# Patient Record
Sex: Female | Born: 1985 | Race: Black or African American | Hispanic: No | Marital: Single | Smoking: Never smoker
Health system: Southern US, Community
[De-identification: ages and names within clinical notes are randomized; demographics above are authoritative.]

## PROBLEM LIST (undated history)

## (undated) ENCOUNTER — Inpatient Hospital Stay: Payer: Self-pay

## (undated) ENCOUNTER — Inpatient Hospital Stay (HOSPITAL_COMMUNITY): Payer: Self-pay

## (undated) DIAGNOSIS — K219 Gastro-esophageal reflux disease without esophagitis: Secondary | ICD-10-CM

## (undated) DIAGNOSIS — G43909 Migraine, unspecified, not intractable, without status migrainosus: Secondary | ICD-10-CM

## (undated) DIAGNOSIS — F329 Major depressive disorder, single episode, unspecified: Secondary | ICD-10-CM

## (undated) DIAGNOSIS — O24419 Gestational diabetes mellitus in pregnancy, unspecified control: Secondary | ICD-10-CM

## (undated) DIAGNOSIS — J45909 Unspecified asthma, uncomplicated: Secondary | ICD-10-CM

## (undated) DIAGNOSIS — F32A Depression, unspecified: Secondary | ICD-10-CM

## (undated) DIAGNOSIS — F419 Anxiety disorder, unspecified: Secondary | ICD-10-CM

## (undated) HISTORY — PX: CHOLECYSTECTOMY: SHX55

## (undated) HISTORY — PX: BARTHOLIN GLAND CYST EXCISION: SHX565

## (undated) HISTORY — PX: RECTAL SURGERY: SHX760

---

## 2006-08-28 ENCOUNTER — Encounter: Payer: Self-pay | Admitting: Maternal & Fetal Medicine

## 2006-10-30 ENCOUNTER — Observation Stay: Payer: Self-pay | Admitting: Obstetrics & Gynecology

## 2008-03-11 ENCOUNTER — Emergency Department: Payer: Self-pay | Admitting: Emergency Medicine

## 2008-04-07 ENCOUNTER — Emergency Department: Payer: Self-pay | Admitting: Emergency Medicine

## 2012-01-30 ENCOUNTER — Ambulatory Visit: Payer: Self-pay | Admitting: Emergency Medicine

## 2012-06-26 ENCOUNTER — Emergency Department: Payer: Self-pay | Admitting: Emergency Medicine

## 2012-06-26 LAB — URINALYSIS, COMPLETE
Bacteria: NONE SEEN
Glucose,UR: NEGATIVE mg/dL (ref 0–75)
Nitrite: NEGATIVE
Ph: 6 (ref 4.5–8.0)
Protein: NEGATIVE
Specific Gravity: 1.027 (ref 1.003–1.030)
WBC UR: <1 /HPF (ref 0–5)

## 2012-06-26 LAB — CBC
HCT: 37.5 % (ref 35.0–47.0)
HGB: 12.8 g/dL (ref 12.0–16.0)
MCH: 28.2 pg (ref 26.0–34.0)
MCHC: 34 g/dL (ref 32.0–36.0)
Platelet: 237 10*3/uL (ref 150–440)
RBC: 4.52 10*6/uL (ref 3.80–5.20)
WBC: 9.3 10*3/uL (ref 3.6–11.0)

## 2012-06-26 LAB — BASIC METABOLIC PANEL
Anion Gap: 7 (ref 7–16)
Calcium, Total: 8.8 mg/dL (ref 8.5–10.1)
Creatinine: 0.8 mg/dL (ref 0.60–1.30)
Glucose: 115 mg/dL — ABNORMAL HIGH (ref 65–99)
Sodium: 140 mmol/L (ref 136–145)

## 2012-06-26 LAB — TROPONIN I: Troponin-I: <0.02 ng/mL

## 2012-08-16 ENCOUNTER — Emergency Department: Payer: Self-pay | Admitting: Emergency Medicine

## 2012-08-16 LAB — COMPREHENSIVE METABOLIC PANEL
Albumin: 3.5 g/dL (ref 3.4–5.0)
Anion Gap: 5 — ABNORMAL LOW (ref 7–16)
Chloride: 108 mmol/L — ABNORMAL HIGH (ref 98–107)
Co2: 25 mmol/L (ref 21–32)
Creatinine: 0.91 mg/dL (ref 0.60–1.30)
EGFR (African American): >60
EGFR (Non-African Amer.): >60
Osmolality: 274 (ref 275–301)
Potassium: 4 mmol/L (ref 3.5–5.1)
SGOT(AST): 33 U/L (ref 15–37)

## 2012-08-16 LAB — URINALYSIS, COMPLETE
Blood: NEGATIVE
Leukocyte Esterase: NEGATIVE
Nitrite: NEGATIVE
Ph: 7 (ref 4.5–8.0)
Protein: NEGATIVE
Squamous Epithelial: 3
WBC UR: <1 /HPF (ref 0–5)

## 2012-08-16 LAB — DRUG SCREEN, URINE
Barbiturates, Ur Screen: NEGATIVE (ref ?–200)
Cocaine Metabolite,Ur ~~LOC~~: NEGATIVE (ref ?–300)
Methadone, Ur Screen: NEGATIVE (ref ?–300)
Phencyclidine (PCP) Ur S: NEGATIVE (ref ?–25)
Tricyclic, Ur Screen: NEGATIVE (ref ?–1000)

## 2012-08-16 LAB — CBC
MCH: 28.3 pg (ref 26.0–34.0)
MCV: 82 fL (ref 80–100)
Platelet: 205 10*3/uL (ref 150–440)
RBC: 4.45 10*6/uL (ref 3.80–5.20)
RDW: 13 % (ref 11.5–14.5)
WBC: 8.3 10*3/uL (ref 3.6–11.0)

## 2012-08-16 LAB — TROPONIN I: Troponin-I: <0.02 ng/mL

## 2012-08-22 ENCOUNTER — Emergency Department: Payer: Self-pay | Admitting: Emergency Medicine

## 2012-08-22 LAB — URINALYSIS, COMPLETE
Bacteria: NONE SEEN
Glucose,UR: NEGATIVE mg/dL (ref 0–75)
Ketone: NEGATIVE
Leukocyte Esterase: NEGATIVE
Nitrite: NEGATIVE
Ph: 8 (ref 4.5–8.0)
Protein: NEGATIVE
RBC,UR: 57 /HPF (ref 0–5)
Specific Gravity: 1.014 (ref 1.003–1.030)
Squamous Epithelial: 2

## 2012-08-22 LAB — DRUG SCREEN, URINE
Benzodiazepine, Ur Scrn: NEGATIVE (ref ?–200)
Cannabinoid 50 Ng, Ur ~~LOC~~: NEGATIVE (ref ?–50)
MDMA (Ecstasy)Ur Screen: NEGATIVE (ref ?–500)
Methadone, Ur Screen: NEGATIVE (ref ?–300)
Opiate, Ur Screen: NEGATIVE (ref ?–300)
Phencyclidine (PCP) Ur S: NEGATIVE (ref ?–25)

## 2012-08-22 LAB — COMPREHENSIVE METABOLIC PANEL
Alkaline Phosphatase: 85 U/L (ref 50–136)
BUN: 6 mg/dL — ABNORMAL LOW (ref 7–18)
Bilirubin,Total: 0.5 mg/dL (ref 0.2–1.0)
Calcium, Total: 8.6 mg/dL (ref 8.5–10.1)
Chloride: 107 mmol/L (ref 98–107)
Creatinine: 1.1 mg/dL (ref 0.60–1.30)
EGFR (African American): >60
EGFR (Non-African Amer.): >60
Potassium: 3.2 mmol/L — ABNORMAL LOW (ref 3.5–5.1)
SGOT(AST): 35 U/L (ref 15–37)
SGPT (ALT): 32 U/L (ref 12–78)
Sodium: 137 mmol/L (ref 136–145)
Total Protein: 7.5 g/dL (ref 6.4–8.2)

## 2012-08-22 LAB — CBC
HCT: 36.7 % (ref 35.0–47.0)
HGB: 12.5 g/dL (ref 12.0–16.0)
MCV: 82 fL (ref 80–100)
RBC: 4.5 10*6/uL (ref 3.80–5.20)
RDW: 13 % (ref 11.5–14.5)
WBC: 9.3 10*3/uL (ref 3.6–11.0)

## 2012-08-22 LAB — ETHANOL: Ethanol: <3 mg/dL

## 2012-08-23 LAB — GC/CHLAMYDIA PROBE AMP

## 2012-10-11 ENCOUNTER — Ambulatory Visit: Payer: Self-pay

## 2012-10-15 ENCOUNTER — Ambulatory Visit: Payer: Self-pay | Admitting: Obstetrics & Gynecology

## 2012-10-26 ENCOUNTER — Ambulatory Visit: Payer: Self-pay | Admitting: Obstetrics & Gynecology

## 2012-11-21 ENCOUNTER — Ambulatory Visit: Payer: Self-pay | Admitting: Family Medicine

## 2012-11-21 LAB — CBC WITH DIFFERENTIAL/PLATELET
Basophil #: 0.1 10*3/uL (ref 0.0–0.1)
Basophil %: 1 %
Eosinophil #: 0.1 10*3/uL (ref 0.0–0.7)
HCT: 41.7 % (ref 35.0–47.0)
Lymphocyte %: 31.4 %
MCH: 27.6 pg (ref 26.0–34.0)
MCHC: 32.8 g/dL (ref 32.0–36.0)
MCV: 84 fL (ref 80–100)
Monocyte #: 0.4 x10 3/mm (ref 0.2–0.9)
Neutrophil #: 4.7 10*3/uL (ref 1.4–6.5)
Neutrophil %: 61.2 %
WBC: 7.6 10*3/uL (ref 3.6–11.0)

## 2012-11-21 LAB — URINALYSIS, COMPLETE
Bacteria: NEGATIVE
Bilirubin,UR: NEGATIVE
Ketone: NEGATIVE
Specific Gravity: 1.01 (ref 1.003–1.030)

## 2012-11-21 LAB — RAPID INFLUENZA A&B ANTIGENS

## 2013-01-24 ENCOUNTER — Inpatient Hospital Stay: Payer: Self-pay | Admitting: Psychiatry

## 2013-01-24 LAB — COMPREHENSIVE METABOLIC PANEL
Albumin: 3.8 g/dL (ref 3.4–5.0)
Bilirubin,Total: 0.4 mg/dL (ref 0.2–1.0)
Calcium, Total: 8.9 mg/dL (ref 8.5–10.1)
Co2: 26 mmol/L (ref 21–32)
Creatinine: 0.98 mg/dL (ref 0.60–1.30)
Glucose: 89 mg/dL (ref 65–99)
Osmolality: 276 (ref 275–301)
Potassium: 3.3 mmol/L — ABNORMAL LOW (ref 3.5–5.1)
SGOT(AST): 22 U/L (ref 15–37)
SGPT (ALT): 24 U/L (ref 12–78)
Total Protein: 8.1 g/dL (ref 6.4–8.2)

## 2013-01-24 LAB — DRUG SCREEN, URINE
Amphetamines, Ur Screen: NEGATIVE (ref ?–1000)
Barbiturates, Ur Screen: NEGATIVE (ref ?–200)
Benzodiazepine, Ur Scrn: POSITIVE (ref ?–200)
Cannabinoid 50 Ng, Ur ~~LOC~~: POSITIVE (ref ?–50)
Cocaine Metabolite,Ur ~~LOC~~: NEGATIVE (ref ?–300)
MDMA (Ecstasy)Ur Screen: POSITIVE (ref ?–500)
Methadone, Ur Screen: NEGATIVE (ref ?–300)
Phencyclidine (PCP) Ur S: NEGATIVE (ref ?–25)
Tricyclic, Ur Screen: POSITIVE (ref ?–1000)

## 2013-01-24 LAB — CBC
HCT: 40.8 % (ref 35.0–47.0)
MCHC: 34.2 g/dL (ref 32.0–36.0)
Platelet: 264 10*3/uL (ref 150–440)
RDW: 13.4 % (ref 11.5–14.5)
WBC: 10.7 10*3/uL (ref 3.6–11.0)

## 2013-01-24 LAB — URINALYSIS, COMPLETE
Bilirubin,UR: NEGATIVE
Blood: NEGATIVE
Glucose,UR: NEGATIVE mg/dL (ref 0–75)
Ph: 6 (ref 4.5–8.0)
Specific Gravity: 1.028 (ref 1.003–1.030)
Squamous Epithelial: 19
WBC UR: 6 /HPF (ref 0–5)

## 2013-01-24 LAB — PREGNANCY, URINE: Pregnancy Test, Urine: NEGATIVE m[IU]/mL

## 2013-01-24 LAB — ETHANOL: Ethanol: <3 mg/dL

## 2013-02-04 ENCOUNTER — Ambulatory Visit: Payer: Self-pay | Admitting: Physician Assistant

## 2013-02-25 ENCOUNTER — Ambulatory Visit: Payer: Self-pay | Admitting: Physician Assistant

## 2013-03-25 ENCOUNTER — Ambulatory Visit: Payer: Self-pay | Admitting: Surgery

## 2013-03-25 LAB — CBC WITH DIFFERENTIAL/PLATELET
BASOS ABS: 0.1 10*3/uL (ref 0.0–0.1)
BASOS PCT: 0.6 %
EOS PCT: 0.3 %
Eosinophil #: 0 10*3/uL (ref 0.0–0.7)
HCT: 44.5 % (ref 35.0–47.0)
HGB: 14.5 g/dL (ref 12.0–16.0)
LYMPHS ABS: 2.1 10*3/uL (ref 1.0–3.6)
LYMPHS PCT: 16 %
MCH: 27.9 pg (ref 26.0–34.0)
MCHC: 32.5 g/dL (ref 32.0–36.0)
MCV: 86 fL (ref 80–100)
MONOS PCT: 6.2 %
Monocyte #: 0.8 x10 3/mm (ref 0.2–0.9)
NEUTROS PCT: 76.9 %
Neutrophil #: 9.8 10*3/uL — ABNORMAL HIGH (ref 1.4–6.5)
Platelet: 300 10*3/uL (ref 150–440)
RBC: 5.18 10*6/uL (ref 3.80–5.20)
RDW: 12.9 % (ref 11.5–14.5)
WBC: 12.8 10*3/uL — AB (ref 3.6–11.0)

## 2013-03-25 LAB — BASIC METABOLIC PANEL
Anion Gap: 4 — ABNORMAL LOW (ref 7–16)
BUN: 13 mg/dL (ref 7–18)
CALCIUM: 9.5 mg/dL (ref 8.5–10.1)
Chloride: 105 mmol/L (ref 98–107)
Co2: 28 mmol/L (ref 21–32)
Creatinine: 0.8 mg/dL (ref 0.60–1.30)
EGFR (Non-African Amer.): >60
GLUCOSE: 100 mg/dL — AB (ref 65–99)
Osmolality: 274 (ref 275–301)
Potassium: 4 mmol/L (ref 3.5–5.1)
Sodium: 137 mmol/L (ref 136–145)

## 2013-04-02 ENCOUNTER — Ambulatory Visit: Payer: Self-pay | Admitting: Surgery

## 2014-01-18 ENCOUNTER — Emergency Department: Payer: Self-pay | Admitting: Emergency Medicine

## 2014-05-23 NOTE — Discharge Summary (Signed)
PATIENT NAME:  Sarah DemarkBROWN, Sarah Contreras MR#:  [id number] DATE OF BIRTH:  1985/12/24  DATE OF ADMISSION:  01/24/2013 DATE OF DISCHARGE:  01/25/2013  HOSPITAL COURSE: See dictated history and physical for details of admission. A 29 year old woman was admitted to the hospital last night because she took an overdose of trazodone. Both last night and today she denies any suicidal intent to it. She states that she has been overwhelmed emotionally yesterday because of the anniversary of the death of the father of her daughter. She just wanted to rest. She has been having problems with depression, but is getting appropriate outpatient treatment. She completely denies any suicidal ideation now. Has not shown any dangerous behavior in the hospital. Has been consistent with her symptoms. She is agreeable to continuing medication and continuing outpatient treatment. At this point, she does not seem to be an acute risk to herself. She has been counseled about the importance of monitoring her mood and thoughts and being very careful and not excessively using any medication. She agrees with all of this. She states that she will be more careful with medication in the future. At this point, she does not appear likely to benefit from further hospitalization and will be discharged home with follow-up in the community.   MENTAL STATUS EXAM AT DISCHARGE: Neatly dressed and groomed woman, looks her stated age. Cooperative with the interview. Good eye contact, normal psychomotor activity. Speech normal rate, tone and volume. Affect reactive, appropriate and normal. No loosening of associations. Denies any auditory or visual hallucinations. No evidence of thought disorder denies suicidal or homicidal ideation. Shows adequate insight and judgment. Normal intelligence. Alert and oriented x 4.   DISCHARGE MEDICATIONS: Topiramate 50 mg per day, which is what she tells me is her normal dose, nortriptyline 20 mg at night, fluoxetine 20 mg a day,  trazodone 150 mg at night, alprazolam 0.5 mg twice a day, albuterol inhaler 2 puffs 4 times a day as needed for shortness of breath, Advair Diskus inhaler 250 mcg 1 puff twice a day. The only prescription she needs is for the Prozac.   LABORATORY RESULTS: Chemistry panel showed slightly low potassium 3.3, chloride 108, otherwise normal. Alcohol level negative. TSH is normal at 1.59. Drug screen positive for MDMA, cannabis and benzodiazepines. CBC all normal. Urinalysis borderline, but contaminated with epithelial cells. Does not appear to necessarily need treatment. She has no symptoms from it.   DISPOSITION: Discharge home. Follow up with CBC.   DIAGNOSIS, PRINCIPAL AND PRIMARY:  AXIS I: Depression, not otherwise specified.   SECONDARY DIAGNOSES: AXIS I: No further diagnosis.  AXIS II: Deferred.  AXIS III: Chronic migraines, asthma.  AXIS IV: Moderate acute stress from recent move with her daughter. Yesterday had acute stress from the anniversary of the death of her daughter's father. That seems to be resolving.  AXIS V: Functioning at time of discharge 55.    ____________________________ Audery AmelJohn T. Clapacs, MD jtc:dp D: 01/25/2013 13:35:38 ET T: 01/25/2013 14:18:10 ET JOB#: [id number]  cc: Audery AmelJohn T. Clapacs, MD, <Dictator> Audery AmelJOHN T CLAPACS MD ELECTRONICALLY SIGNED 01/26/2013 17:00

## 2014-05-23 NOTE — H&P (Signed)
PATIENT NAME:  Sarah Contreras, Sarah Contreras MR#:  [id number] DATE OF BIRTH:  07-06-1985  DATE OF ADMISSION:  01/24/2013  DATE OF ASSESSMENT: 01/25/2013  IDENTIFYING INFORMATION AND CHIEF COMPLAINT: A 29 year old woman admitted from the Emergency Room because of concern about a suicide attempt.   CHIEF COMPLAINT: "I was just overwhelmed and wanted to rest."   HISTORY OF PRESENT ILLNESS: The patient presented to the Emergency Room by EMS last night. She says she called EMS herself when she started to feel sick and dizzy after taking an excessive number of trazodone pills. She says yesterday she was feeling overwhelmed because it was the anniversary of the death of her daughter's father. She felt like she just needed some rest and to relax and so she poured out a handful of her trazodone tablets and took them. She is not sure exactly how many there were, says it could be as many as 20. They were 50 mg pills. She then started after a while to feel sick to her stomach and called 911 herself. She denies at any point that she was having suicidal thoughts. She says she has been mildly depressed recently, but has been getting help from her therapist and psychiatrist. Generally, she has been functioning well and going to work. With her current medication her sleep is usually okay. Her appetite is fairly normal. She denies suicidal ideation, denies any psychotic symptoms. She denies any hallucinations. No homicidal ideation. Denies that she is abusing substances. She is compliant with outpatient treatment.   PAST PSYCHIATRIC HISTORY: She has been seeing Dr. Janeece Riggers at [facility] and also sees a therapist there. In the past, she has been on antidepressants including Zoloft, Celexa, and Wellbutrin. Most recently Dr. Janeece Riggers has switched her to Prozac and she has been taking it for about a week, although she did not take it for the past 3 days. She also takes Xanax 0.5 mg twice a day. She denies any previous suicide attempts or violence. Denies any  previous psychiatric hospitalizations.   SOCIAL HISTORY: The patient lives with her 89-year-old daughter. This is her only child. The daughter's father died exactly 1 year ago yesterday from an automobile accident. The patient identifies as lesbian, but says she is not in a relationship. Her relationship with her family is generally pretty good. She works for the school system regularly. Has a strong sense of responsibility to her daughter.   FAMILY HISTORY: Positive for bipolar disorder in a couple members of her family.   PAST MEDICAL HISTORY: The patient has asthma for which she uses an Advair inhaler as well as p.r.n. albuterol. Also has chronic migraines for which she takes topiramate and nortriptyline regularly.   SUBSTANCE ABUSE HISTORY: Denies any history of alcohol or drug abuse.   REVIEW OF SYSTEMS: Currently says her mood is feeling much better. Denies any depression. Denies suicidal or homicidal ideation. Denies hallucinations. She is not acutely having a headache. Does not feel sick to her stomach. No chest pain. No shortness of breath. Generally negative review of systems.   CURRENT MEDICATIONS: Prozac 20 mg per day, Xanax 0.5 mg twice a day, nortriptyline 20 mg at night, topiramate 50 mg per day, Advair Diskus twice a day, albuterol inhaler p.r.n.   ALLERGIES: SUMATRIPTAN.   MENTAL STATUS EXAMINATION: Neatly groomed woman, looks her stated age, cooperative with the interview. Good eye contact. Normal psychomotor activity. Speech normal rate, tone, and volume. Affect euthymic, reactive, appropriate. No signs of being depressed. Mood stated as being pretty  good today. Thoughts are lucid. No indication of loosening of associations or delusions. Denies auditory or visual hallucinations. Denies any current suicidal or homicidal ideation. Denies any morbid ruminations about death. Has strong positive feelings towards her daughter and her life in general. Alert and oriented x4. Normal  intelligence.   PHYSICAL EXAMINATION: GENERAL: Healthy-appearing woman in no acute distress.  SKIN: No skin lesions identified.  HEENT: Pupils equal and reactive.  NEUROLOGIC: Gait within normal limits. Full range of motion at all extremities. Strength and reflexes normal and symmetric throughout. Cranial nerves symmetric and normal.  LUNGS: Clear with no wheezes.  HEART: Regular rate and rhythm.  ABDOMEN: Soft, nontender. Normal bowel sounds.  VITAL SIGNS: Temperature 99.5, pulse 94, respirations 16, blood pressure 113/77.   LABORATORY RESULTS: Urinalysis is borderline for urinary tract infection, but has a lot of epithelial cells in it. Pregnancy test negative. Drug screen positive for cannabis and MDMA. TSH normal at 1.59. CBC normal. Alcohol negative. Chemistry shows low potassium 3.3, chloride elevated at 108.   ASSESSMENT: A [age] year old woman who took an overdose although not a fatal one of her trazodone. She has consistently indicated she had no suicidal ideation. She did call 911 herself and has been cooperative with treatment. She does have depression, but has multiple positive things in her life to live for. No sign of psychosis. No substance abuse. The patient appears to have been overwhelmed yesterday and made a bad decision about how many trazodone to take. She does not appear to be acutely suicidal. She has appropriate outpatient treatment in the community. Does not appear to have any further need for inpatient treatment.   TREATMENT PLAN: Continue current medication although the plan for today is to discharge her home. She has a follow-up appointment with her therapist on Monday and will follow up with Dr. Janeece RiggersSu for medicine management. I will give her a new prescription for the Prozac since she says she misplaced it in a recent move, which is why she has not had it for the last few days; otherwise, no new prescriptions needed. The patient has been educated about the importance of being  strictly compliant with medication treatment and getting help if she is feeling overwhelmed or ever has any suicidal thoughts.   DIAGNOSIS, AND PRIMARY:  AXIS I: Depression, not otherwise specified.   SECONDARY DIAGNOSES: AXIS I: No further.  AXIS II: Deferred.  AXIS III: Asthma, migraine headaches.  AXIS IV: Moderate from being a single parent and acute severe yesterday from an anniversary of a death.  AXIS V: Functioning at time of evaluation 50.  ____________________________ Audery AmelJohn T. Trevell Pariseau, MD jtc:sb D: 01/25/2013 13:24:30 ET T: 01/25/2013 13:57:57 ET JOB#: [id number]  cc: Audery AmelJohn T. Janne Faulk, MD, <Dictator> Audery AmelJOHN T Joice Nazario MD ELECTRONICALLY SIGNED 01/26/2013 17:00

## 2014-05-23 NOTE — Op Note (Signed)
PATIENT NAME:  Sarah Contreras, Sarah Contreras MR#:  [id number] DATE OF BIRTH:  06-17-1985  DATE OF PROCEDURE:  10/26/2012  PREOPERATIVE DIAGNOSIS: Left chronic recurrent Bartholin cyst.   POSTOPERATIVE DIAGNOSIS: Left chronic recurrent Bartholin cyst.   PROCEDURE PERFORMED: Left marsupialization.   SURGEON: Dierdre Searles. Paul Hobart Marte, MD   ANESTHESIA: General.  ESTIMATED BLOOD LOSS: Minimal.   COMPLICATIONS: None.   FINDINGS: As above.   DISPOSITION: To recovery room, stable.   TECHNIQUE: The patient is prepped and draped in the usual fashion, after adequate anesthesia is obtained, in the dorsal lithotomy position. A drainage point from the chronic Bartholin cyst is visualized and grasped with a Allis clamp. An elliptical skin incision is performed at this site. Dissection with a hemostat reveals the cavity and an area of Bartholin cyst. The edges are then sutured to the skin edge in a circumferential manner, in an interrupted fashion, to create a marsupialization pouch so that it can drain and heal over time. The patient tolerates this procedure well and goes to the recovery room in stable condition. All sponge, instrument and needle counts are correct.  ____________________________ R. Annamarie MajorPaul Keerthana Vanrossum, MD rph:sb D: 10/26/2012 17:04:04 ET T: 10/26/2012 17:10:11 ET JOB#: [id number]  cc: Dierdre Searles. Paul Fransisco Messmer, MD, <Dictator> Nadara MustardOBERT P Andromeda Poppen MD ELECTRONICALLY SIGNED 10/26/2012 23:17

## 2014-05-24 NOTE — Op Note (Signed)
PATIENT NAME:  Sarah DemarkBROWN, Evoleth N MR#:  [id number] DATE OF BIRTH:  1985/09/17  DATE OF PROCEDURE:  04/02/2013  PREOPERATIVE DIAGNOSIS:  Anal fissure.  POSTOPERATIVE DIAGNOSIS:  Anal fissure.  OPERATIONS:  Rectal exam under anesthesia, lateral anal sphincterotomy.   SURGEON: Dr. Michela PitcherEly.  ANESTHESIA: General.   OPERATIVE PROCEDURE: With the patient in the supine position and after the induction of appropriate general anesthesia, the patient was placed in lithotomy position, appropriately padded and positioned. Perianal area was prepped with Betadine with draped with sterile towels. Careful external examination under general anesthesia did reveal a large posterior midline fissure. His rectum was quite tight, barely admitting my index finger. The rectum was slowly dilated under vision using bivalve retractor.  The large ulcer posteriorly was debrided by just scraping the granulation tissue off the surface. No attempt was made to excise the fissure. A lateral anal sphincterotomy was then performed using closed technique, using the bivalve retractor to identify the appropriate muscle layer. The 11 blade was inserted perpendicularly, turned 90 degrees and removed. The index finger was then used to break up the remaining fibers. There was some bleeding from the site, which was controlled with a figure-of-eight sutures of 3-0 Vicryl. The rectum was then easily dilated to 2 fingers without consequence. A packing of Gelfoam and Avitene was inserted in the rectum and the area infiltrated with Exparel local anesthesia.  Sterile dressing was applied. The patient returned to the recovery room having tolerated the procedure well.  Sponge, instrument and needle count were correct x 2 in the operating room.       ____________________________ Quentin Orealph L. Ely III, MD rle:dmm D: 04/02/2013 12:30:50 ET T: 04/02/2013 19:59:11 ET JOB#: [id number]  cc: Carmie Endalph L. Ely III, MD, <Dictator> Letitia CaulHeidi M. Grandis, MD Quentin OreALPH L ELY  MD ELECTRONICALLY SIGNED 04/03/2013 7:39

## 2014-07-07 ENCOUNTER — Other Ambulatory Visit: Payer: Self-pay | Admitting: Family Medicine

## 2014-07-07 ENCOUNTER — Ambulatory Visit
Admission: RE | Admit: 2014-07-07 | Discharge: 2014-07-07 | Disposition: A | Discharge status: Home / Self Care | Payer: Medicaid Other | Source: Ambulatory Visit | Attending: Family Medicine | Admitting: Family Medicine

## 2014-07-07 DIAGNOSIS — R102 Pelvic and perineal pain: Secondary | ICD-10-CM | POA: Diagnosis present

## 2014-07-07 DIAGNOSIS — N859 Noninflammatory disorder of uterus, unspecified: Secondary | ICD-10-CM | POA: Insufficient documentation

## 2014-07-07 DIAGNOSIS — N839 Noninflammatory disorder of ovary, fallopian tube and broad ligament, unspecified: Secondary | ICD-10-CM | POA: Diagnosis not present

## 2014-07-07 DIAGNOSIS — G8929 Other chronic pain: Secondary | ICD-10-CM | POA: Diagnosis present

## 2015-06-12 ENCOUNTER — Emergency Department
Admission: EM | Admit: 2015-06-12 | Discharge: 2015-06-13 | Disposition: A | Discharge status: Home / Self Care | Payer: BLUE CROSS/BLUE SHIELD | Attending: Emergency Medicine | Admitting: Emergency Medicine

## 2015-06-12 ENCOUNTER — Emergency Department: Payer: BLUE CROSS/BLUE SHIELD

## 2015-06-12 ENCOUNTER — Encounter: Payer: Self-pay | Admitting: Emergency Medicine

## 2015-06-12 DIAGNOSIS — Z9049 Acquired absence of other specified parts of digestive tract: Secondary | ICD-10-CM | POA: Insufficient documentation

## 2015-06-12 DIAGNOSIS — R1033 Periumbilical pain: Secondary | ICD-10-CM | POA: Diagnosis present

## 2015-06-12 DIAGNOSIS — F329 Major depressive disorder, single episode, unspecified: Secondary | ICD-10-CM | POA: Insufficient documentation

## 2015-06-12 DIAGNOSIS — J45909 Unspecified asthma, uncomplicated: Secondary | ICD-10-CM | POA: Insufficient documentation

## 2015-06-12 DIAGNOSIS — Z79899 Other long term (current) drug therapy: Secondary | ICD-10-CM | POA: Diagnosis not present

## 2015-06-12 DIAGNOSIS — R109 Unspecified abdominal pain: Secondary | ICD-10-CM

## 2015-06-12 HISTORY — DX: Migraine, unspecified, not intractable, without status migrainosus: G43.909

## 2015-06-12 HISTORY — DX: Unspecified asthma, uncomplicated: J45.909

## 2015-06-12 HISTORY — DX: Major depressive disorder, single episode, unspecified: F32.9

## 2015-06-12 HISTORY — DX: Depression, unspecified: F32.A

## 2015-06-12 LAB — URINALYSIS COMPLETE WITH MICROSCOPIC (ARMC ONLY)
BILIRUBIN URINE: NEGATIVE
GLUCOSE, UA: NEGATIVE mg/dL
HGB URINE DIPSTICK: NEGATIVE
LEUKOCYTES UA: NEGATIVE
NITRITE: NEGATIVE
Protein, ur: 30 mg/dL — AB
RBC / HPF: NONE SEEN RBC/hpf (ref 0–5)
SPECIFIC GRAVITY, URINE: 1.027 (ref 1.005–1.030)
pH: 5 (ref 5.0–8.0)

## 2015-06-12 LAB — CBC
HEMATOCRIT: 45.2 % (ref 35.0–47.0)
Hemoglobin: 14.9 g/dL (ref 12.0–16.0)
MCH: 27.7 pg (ref 26.0–34.0)
MCHC: 32.9 g/dL (ref 32.0–36.0)
MCV: 84.2 fL (ref 80.0–100.0)
Platelets: 297 10*3/uL (ref 150–440)
RBC: 5.37 MIL/uL — AB (ref 3.80–5.20)
RDW: 12.4 % (ref 11.5–14.5)
WBC: 11.3 10*3/uL — AB (ref 3.6–11.0)

## 2015-06-12 LAB — COMPREHENSIVE METABOLIC PANEL
ALT: 13 U/L — ABNORMAL LOW (ref 14–54)
ANION GAP: 8 (ref 5–15)
AST: 20 U/L (ref 15–41)
Albumin: 4.8 g/dL (ref 3.5–5.0)
Alkaline Phosphatase: 71 U/L (ref 38–126)
BILIRUBIN TOTAL: 0.4 mg/dL (ref 0.3–1.2)
BUN: 8 mg/dL (ref 6–20)
CO2: 26 mmol/L (ref 22–32)
Calcium: 9.7 mg/dL (ref 8.9–10.3)
Chloride: 108 mmol/L (ref 101–111)
Creatinine, Ser: 0.94 mg/dL (ref 0.44–1.00)
Glucose, Bld: 126 mg/dL — ABNORMAL HIGH (ref 65–99)
POTASSIUM: 4 mmol/L (ref 3.5–5.1)
Sodium: 142 mmol/L (ref 135–145)
TOTAL PROTEIN: 8 g/dL (ref 6.5–8.1)

## 2015-06-12 LAB — POCT PREGNANCY, URINE: Preg Test, Ur: NEGATIVE

## 2015-06-12 MED ORDER — DIATRIZOATE MEGLUMINE & SODIUM 66-10 % PO SOLN
15.0000 mL | Freq: Once | ORAL | Status: AC
Start: 2015-06-12 — End: 2015-06-12
  Administered 2015-06-12: 15 mL via ORAL

## 2015-06-12 MED ORDER — IOPAMIDOL (ISOVUE-300) INJECTION 61%
100.0000 mL | Freq: Once | INTRAVENOUS | Status: AC | PRN
  Administered 2015-06-12: 100 mL via INTRAVENOUS

## 2015-06-12 MED ORDER — MORPHINE SULFATE (PF) 4 MG/ML IV SOLN: 4.0000 mg | Freq: Once | INTRAVENOUS | Status: DC

## 2015-06-12 MED ORDER — ONDANSETRON HCL 4 MG/2ML IJ SOLN
4.0000 mg | Freq: Once | INTRAMUSCULAR | Status: AC
  Administered 2015-06-12: 4 mg via INTRAVENOUS
  Filled 2015-06-12: qty 2

## 2015-06-12 MED ORDER — SODIUM CHLORIDE 0.9 % IV SOLN
1000.0000 mL | Freq: Once | INTRAVENOUS | Status: AC
  Administered 2015-06-12: 1000 mL via INTRAVENOUS

## 2015-06-12 NOTE — ED Notes (Signed)
Pt states had cholecystectomy on 05/04/2015. Pt states she had blood drawn at md's office and was told "my white blood cell count is really high and i should go to the er." pt states her doctor was concerned for "abscess". Pt states she has had fevers and diarrhea for "a week".

## 2015-06-12 NOTE — ED Notes (Signed)
Pt return from ct scan.  Resume care from terry rn.  Pt alert.  Family with pt.

## 2015-06-12 NOTE — ED Provider Notes (Signed)
[facility] Emergency Department Provider Note  ____________________________________________    I have reviewed the triage vital signs and the nursing notes.   HISTORY  Chief Complaint Abdominal Pain    HPI Sarah Contreras is a 30 y.o. female who presents with abdominal pain. Patient reports periumbilical moderate to severe abdominal pain which is worsened over the last week. She saw her physician who told her that she had a high white blood cell count to come to the emergency department. Patient denies fever she does report nausea and vomiting and diarrhea.     Past Medical History  Diagnosis Date  . Migraine   . Asthma   . Depression     There are no active problems to display for this patient.   Past Surgical History  Procedure Laterality Date  . Cholecystectomy      No current outpatient prescriptions on file.  Allergies Review of patient's allergies indicates no known allergies.  History reviewed. No pertinent family history.  Social History Social History  Substance Use Topics  . Smoking status: Never Smoker   . Smokeless tobacco: Never Used  . Alcohol Use: No    Review of Systems  Constitutional: Negative for fever. Eyes: Negative for redness ENT: Negative for sore throat Cardiovascular: Negative for chest pain Respiratory: Negative for shortness of breath. Gastrointestinal: As above Genitourinary: Negative for dysuria. Musculoskeletal: Negative for back pain. Skin: Negative for rash. Neurological: Negative for focal weakness Psychiatric: no anxiety    ____________________________________________   PHYSICAL EXAM:  VITAL SIGNS: ED Triage Vitals  Enc Vitals Group     BP 06/12/15 1911 150/94 mmHg     Pulse Rate 06/12/15 1911 107     Resp 06/12/15 1911 20     Temp 06/12/15 1911 98.1 F (36.7 C)     Temp Source 06/12/15 1911 Oral     SpO2 06/12/15 1911 100 %     Weight 06/12/15 1911 168 lb (76.204 kg)     Height  06/12/15 1911 5\' 1"  (1.549 m)     Head Cir --      Peak Flow --      Pain Score 06/12/15 1912 6     Pain Loc --      Pain Edu? --      Excl. in GC? --      Constitutional: Alert and oriented. Well appearing and in no distress.  Eyes: Conjunctivae are normal. No erythema or injection ENT   Head: Normocephalic and atraumatic.   Mouth/Throat: Mucous membranes are moist. Cardiovascular: Normal rate, regular rhythm. Normal and symmetric distal pulses are present in the upper extremities.  Respiratory: Normal respiratory effort without tachypnea nor retractions. Breath sounds are clear and equal bilaterally.  Gastrointestinal: Mild tenderness to palpation periumbilically. No distention. There is no CVA tenderness. Genitourinary: deferred Musculoskeletal:  No lower extremity tenderness nor edema. Neurologic:  Normal speech and language. No gross focal neurologic deficits are appreciated. Skin:  Skin is warm, dry and intact. No rash noted. Psychiatric: Mood and affect are normal. Patient exhibits appropriate insight and judgment.  ____________________________________________    LABS (pertinent positives/negatives)  Labs Reviewed  COMPREHENSIVE METABOLIC PANEL - Abnormal; Notable for the following:    Glucose, Bld 126 (*)    ALT 13 (*)    All other components within normal limits  CBC - Abnormal; Notable for the following:    WBC 11.3 (*)    RBC 5.37 (*)    All other components within normal  limits  URINALYSIS COMPLETEWITH MICROSCOPIC ([facility] ONLY) - Abnormal; Notable for the following:    Color, Urine YELLOW (*)    APPearance HAZY (*)    Ketones, ur TRACE (*)    Protein, ur 30 (*)    Bacteria, UA RARE (*)    Squamous Epithelial / LPF 6-30 (*)    All other components within normal limits  POC URINE PREG, ED  POCT PREGNANCY, URINE    ____________________________________________   EKG  None  ____________________________________________    RADIOLOGY  CT abdomen  and pelvis pending  ____________________________________________   PROCEDURES  Procedure(s) performed: none  Critical Care performed: none  ____________________________________________   INITIAL IMPRESSION / ASSESSMENT AND PLAN / ED COURSE  Pertinent labs & imaging results that were available during my care of the patient were reviewed by me and considered in my medical decision making (see chart for details).  Patient presents with moderate-to-severe periumbilical pain with nausea and vomiting and diarrhea. Gastroenteritis versus colitis seem most likely that the patient is worried because she had a cholecystectomy one month ago and her physician told her she may have an abscess.  ____________________________________________   FINAL CLINICAL IMPRESSION(S) / ED DIAGNOSES  Abdominal pain       Jene Every, MD 06/12/15 2334

## 2015-06-12 NOTE — Discharge Instructions (Signed)

## 2015-06-13 NOTE — ED Notes (Signed)
Dr Cyril Loosenkinner in with pt.  Iv dc'ed.  D/c inst to pt.

## 2015-08-13 ENCOUNTER — Emergency Department (HOSPITAL_COMMUNITY)
Admission: EM | Admit: 2015-08-13 | Discharge: 2015-08-14 | Disposition: A | Discharge status: Home / Self Care | Payer: Medicaid Other | Attending: Emergency Medicine | Admitting: Emergency Medicine

## 2015-08-13 ENCOUNTER — Encounter (HOSPITAL_COMMUNITY): Payer: Self-pay | Admitting: Emergency Medicine

## 2015-08-13 DIAGNOSIS — Z3A01 Less than 8 weeks gestation of pregnancy: Secondary | ICD-10-CM | POA: Diagnosis not present

## 2015-08-13 DIAGNOSIS — Z79899 Other long term (current) drug therapy: Secondary | ICD-10-CM | POA: Diagnosis not present

## 2015-08-13 DIAGNOSIS — O26851 Spotting complicating pregnancy, first trimester: Secondary | ICD-10-CM | POA: Insufficient documentation

## 2015-08-13 DIAGNOSIS — F329 Major depressive disorder, single episode, unspecified: Secondary | ICD-10-CM | POA: Insufficient documentation

## 2015-08-13 DIAGNOSIS — N9489 Other specified conditions associated with female genital organs and menstrual cycle: Secondary | ICD-10-CM | POA: Diagnosis not present

## 2015-08-13 DIAGNOSIS — R102 Pelvic and perineal pain: Secondary | ICD-10-CM

## 2015-08-13 DIAGNOSIS — R1032 Left lower quadrant pain: Secondary | ICD-10-CM | POA: Diagnosis present

## 2015-08-13 DIAGNOSIS — J45909 Unspecified asthma, uncomplicated: Secondary | ICD-10-CM | POA: Insufficient documentation

## 2015-08-13 DIAGNOSIS — O26899 Other specified pregnancy related conditions, unspecified trimester: Secondary | ICD-10-CM

## 2015-08-13 DIAGNOSIS — O26859 Spotting complicating pregnancy, unspecified trimester: Secondary | ICD-10-CM

## 2015-08-13 NOTE — ED Notes (Signed)
Pt states she recently found out she was pregnant and for the past 2 weeks she has had spotting and lower abd cramping  Pt states she has asthma and has been using her inhaler more frequently and she is having chest tightness

## 2015-08-14 ENCOUNTER — Emergency Department (HOSPITAL_COMMUNITY): Payer: Medicaid Other

## 2015-08-14 LAB — CBC
HCT: 40 % (ref 36.0–46.0)
Hemoglobin: 13.6 g/dL (ref 12.0–15.0)
MCH: 28.3 pg (ref 26.0–34.0)
MCHC: 34 g/dL (ref 30.0–36.0)
MCV: 83.2 fL (ref 78.0–100.0)
PLATELETS: 239 10*3/uL (ref 150–400)
RBC: 4.81 MIL/uL (ref 3.87–5.11)
RDW: 12.9 % (ref 11.5–15.5)
WBC: 13 10*3/uL — ABNORMAL HIGH (ref 4.0–10.5)

## 2015-08-14 LAB — URINALYSIS, ROUTINE W REFLEX MICROSCOPIC
Bilirubin Urine: NEGATIVE
Glucose, UA: NEGATIVE mg/dL
Hgb urine dipstick: NEGATIVE
KETONES UR: NEGATIVE mg/dL
LEUKOCYTES UA: NEGATIVE
NITRITE: NEGATIVE
PH: 6 (ref 5.0–8.0)
Protein, ur: NEGATIVE mg/dL
SPECIFIC GRAVITY, URINE: 1.034 — AB (ref 1.005–1.030)

## 2015-08-14 LAB — ABO/RH: ABO/RH(D): B POS

## 2015-08-14 LAB — GC/CHLAMYDIA PROBE AMP (~~LOC~~) NOT AT ARMC
Chlamydia: NEGATIVE
Neisseria Gonorrhea: NEGATIVE

## 2015-08-14 LAB — WET PREP, GENITAL
SPERM: NONE SEEN
TRICH WET PREP: NONE SEEN
YEAST WET PREP: NONE SEEN

## 2015-08-14 LAB — PREGNANCY, URINE: Preg Test, Ur: POSITIVE — AB

## 2015-08-14 LAB — HCG, QUANTITATIVE, PREGNANCY: HCG, BETA CHAIN, QUANT, S: 44396 m[IU]/mL — AB (ref <5)

## 2015-08-14 MED ORDER — PRENATAL COMPLETE 14-0.4 MG PO TABS: 1.0000 | ORAL_TABLET | Freq: Every day | ORAL | Status: DC

## 2015-08-14 NOTE — ED Notes (Addendum)
Attempted lab draw x 2 but unsuccessful. 

## 2015-08-14 NOTE — Discharge Instructions (Signed)
Return to the ED with any concerns including vaginal bleeding and soaking more than one pad per hour, worsening abdominal pain, fainting, vomiting and not able to keep down liquids, decreased level of alertness/lethargy, or any other alarming symptoms

## 2015-08-14 NOTE — ED Provider Notes (Signed)
CSN: [id number]     Arrival date & time 08/13/15  2328 History  By signing my name below, I, Rosario Adie, attest that this documentation has been prepared under the direction and in the presence of Jerelyn Scott, MD. Electronically Signed: Rosario Adie, ED Scribe. 08/14/2015. 1:10 AM.   Chief Complaint  Patient presents with  . Threatened Miscarriage   Patient is a 30 y.o. female presenting with abdominal pain. The history is provided by the patient. No language interpreter was used.  Abdominal Pain Pain location:  LLQ and RLQ Pain quality: cramping   Pain radiates to:  Does not radiate Pain severity:  Moderate Onset quality:  Gradual Duration:  2 weeks Timing:  Intermittent Progression:  Worsening Chronicity:  New Context comment:  Pregnancy Associated symptoms: vaginal bleeding (spotting)   Associated symptoms: no dysuria, no fever, no nausea and no vomiting   Risk factors: multiple surgeries and pregnancy    HPI Comments: Sarah Contreras is a 30 y.o. female with a PMHx of asthma and depression who presents to the Emergency Department complaining of gradual onset, gradually worsening, intermittent diffuse lower abdominal pain with associated vaginal spotting x 2 weeks. She is also complaining of chest pain x 2 days. Pt notes that she recently performed an at home pregnancy test which was positive, and her LNMP was on 07/01/15. Pt is G2P0A1, with sudden miscarriage. She has a PSHx to the abdomen including a Cholecystectomy, Bartholin gland cyst excision, and Cesarean section. No exacerbating or alleviating factors noted. Pt denies nausea, vomiting, fever, dysuria, or any other symptoms.   Past Medical History  Diagnosis Date  . Migraine   . Asthma   . Depression    Past Surgical History  Procedure Laterality Date  . Cholecystectomy    . Cesarean section    . Bartholin gland cyst excision     Family History  Problem Relation Age of Onset  . Diabetes Other   .  Hypertension Other   . Cancer Other    Social History  Substance Use Topics  . Smoking status: Never Smoker   . Smokeless tobacco: Never Used  . Alcohol Use: No   OB History    Gravida Para Term Preterm AB TAB SAB Ectopic Multiple Living   1              Review of Systems  Constitutional: Negative for fever.  Gastrointestinal: Positive for abdominal pain (lower). Negative for nausea and vomiting.  Genitourinary: Positive for vaginal bleeding (spotting). Negative for dysuria.  All other systems reviewed and are negative.  Allergies  Sumatriptan  Home Medications   Prior to Admission medications   Medication Sig Start Date End Date Taking? Authorizing Provider  albuterol (PROVENTIL HFA) 108 (90 Base) MCG/ACT inhaler Inhale 2 puffs into the lungs every 4 (four) hours as needed for wheezing or shortness of breath.  06/12/15 06/11/16 Yes Historical Provider, MD  amitriptyline (ELAVIL) 25 MG tablet Take 25 mg by mouth daily. 04/19/15  Yes Historical Provider, MD  Prenatal Vit-Fe Fumarate-FA (PRENATAL COMPLETE) 14-0.4 MG TABS Take 1 tablet by mouth daily. 08/14/15   Jerelyn Scott, MD   BP 114/87 mmHg  Pulse 99  Temp(Src) 98.1 F (36.7 C) (Oral)  Resp 16  SpO2 92%  LMP 07/01/2015 (Exact Date)  Vitals reviewed Physical Exam  Physical Examination: General appearance - alert, well appearing, and in no distress Mental status - alert, oriented to person, place, and time Eyes - no conjunctival injection  no scleral icterus Mouth - mucous membranes moist, pharynx normal without lesions Chest - clear to auscultation, no wheezes, rales or rhonchi, symmetric air entry Heart - normal rate, regular rhythm, normal S1, S2, no murmurs, rubs, clicks or gallops Abdomen - soft, mild diffuse lower abdominal tenderness, no gaurding or rebound, nondistended, no masses or organomegaly Pelvic- no CMT, no adnexal tenderness, no blood in vaginal vault, cervical os closed Neurological - alert, oriented,  normal speech Extremities - peripheral pulses normal, no pedal edema, no clubbing or cyanosis Skin - normal coloration and turgor, no rashes  ED Course  Procedures (including critical care time)  DIAGNOSTIC STUDIES: Oxygen Saturation is 100% on RA, normal by my interpretation.   COORDINATION OF CARE: 1:07 AM-Discussed next steps with pt including UA and pregnancy test. Pt verbalized understanding and is agreeable with the plan.   Labs Review Labs Reviewed  WET PREP, GENITAL - Abnormal; Notable for the following:    Clue Cells Wet Prep HPF POC PRESENT (*)    WBC, Wet Prep HPF POC MANY (*)    All other components within normal limits  URINALYSIS, ROUTINE W REFLEX MICROSCOPIC (NOT AT [facility]) - Abnormal; Notable for the following:    Specific Gravity, Urine 1.034 (*)    All other components within normal limits  PREGNANCY, URINE - Abnormal; Notable for the following:    Preg Test, Ur POSITIVE (*)    All other components within normal limits  CBC - Abnormal; Notable for the following:    WBC 13.0 (*)    All other components within normal limits  HCG, QUANTITATIVE, PREGNANCY - Abnormal; Notable for the following:    hCG, Beta Chain, Quant, S 40981 (*)    All other components within normal limits  ABO/RH  GC/CHLAMYDIA PROBE AMP ([place]) NOT AT [facility]   Imaging Review US Ob Comp Less 14 Wks  08/14/2015  CLINICAL DATA:  Intermittent in increasing lower abdominal cramping and spotting for 2 weeks. Estimated gestational age by LMP is 6 weeks 2 days. Quantitative beta HCG is 44,396. EXAM: OBSTETRIC <14 WK [place] AND TRANSVAGINAL OB US TECHNIQUE: Both transabdominal and transvaginal ultrasound examinations were performed for complete evaluation of the gestation as well as the maternal uterus, adnexal regions, and pelvic cul-de-sac. Transvaginal technique was performed to assess early pregnancy. COMPARISON:  CT abdomen and pelvis 06/12/2015 FINDINGS: Intrauterine gestational sac: A single  intrauterine pregnancy is demonstrated. Yolk sac:  Yolk sac is visualized. Embryo:  Fetal pole is present. Cardiac Activity: Fetal cardiac activity is observed. Heart Rate: 111  bpm CRL:  5  mm   6 w   1 d                  [place] EDC: 04/07/2016 Subchorionic hemorrhage:  None visualized. Maternal uterus/adnexae: Uterus is anteverted and mildly retroflexed. No myometrial mass lesions identified. Small nabothian cysts in the cervix. Both ovaries are visualized with normal appearance. No abnormal adnexal masses. No free fluid in the pelvis. IMPRESSION: Single intrauterine pregnancy. Estimated gestational age by crown-rump length is 6 weeks 1 day. No acute complication demonstrated on ultrasound. Electronically Signed   By: Burman Nieves M.D.   On: 08/14/2015 04:53   US Ob Transvaginal  08/14/2015  CLINICAL DATA:  Intermittent in increasing lower abdominal cramping and spotting for 2 weeks. Estimated gestational age by LMP is 6 weeks 2 days. Quantitative beta HCG is 44,396. EXAM: OBSTETRIC <14 WK [place] AND TRANSVAGINAL OB US TECHNIQUE: Both transabdominal and transvaginal  ultrasound examinations were performed for complete evaluation of the gestation as well as the maternal uterus, adnexal regions, and pelvic cul-de-sac. Transvaginal technique was performed to assess early pregnancy. COMPARISON:  CT abdomen and pelvis 06/12/2015 FINDINGS: Intrauterine gestational sac: A single intrauterine pregnancy is demonstrated. Yolk sac:  Yolk sac is visualized. Embryo:  Fetal pole is present. Cardiac Activity: Fetal cardiac activity is observed. Heart Rate: 111  bpm CRL:  5  mm   6 w   1 d                  US EDC: 04/07/2016 Subchorionic hemorrhage:  None visualized. Maternal uterus/adnexae: Uterus is anteverted and mildly retroflexed. No myometrial mass lesions identified. Small nabothian cysts in the cervix. Both ovaries are visualized with normal appearance. No abnormal adnexal masses. No free fluid in the pelvis. IMPRESSION:  Single intrauterine pregnancy. Estimated gestational age by crown-rump length is 6 weeks 1 day. No acute complication demonstrated on ultrasound. Electronically Signed   By: Burman NievesWilliam  Stevens M.D.   On: 08/14/2015 04:53     I have personally reviewed and evaluated these images and lab results as part of my medical decision-making.   EKG Interpretation None      MDM   Final diagnoses:  Spotting in early pregnancy    Pt presenting with c/o lower abdominal cramping and vaginal spotting- she took a home pregnancy test.  Pt with + pregnancy in the ED, us shows + IUP at approx [redacted] weeks gestation.  Pelvic exam reassuring.  Pt declined blood draw to check for RH status.  Pt given rx for prenatal vitamins and information for OB followup.  Discharged with strict return precautions.  Pt agreeable with plan.   I personally performed the services described in this documentation, which was scribed in my presence. The recorded information has been reviewed and is accurate.      Jerelyn ScottMartha Linker, MD 08/14/15 [phone]

## 2015-08-14 NOTE — ED Notes (Signed)
Pt refusing to be stuck again

## 2015-09-29 ENCOUNTER — Inpatient Hospital Stay (HOSPITAL_COMMUNITY)
Admission: AD | Admit: 2015-09-29 | Discharge: 2015-09-29 | Disposition: A | Discharge status: Home / Self Care | Payer: Medicaid Other | Source: Ambulatory Visit | Attending: Obstetrics and Gynecology | Admitting: Obstetrics and Gynecology

## 2015-09-29 ENCOUNTER — Encounter (HOSPITAL_COMMUNITY): Payer: Self-pay | Admitting: *Deleted

## 2015-09-29 DIAGNOSIS — O26891 Other specified pregnancy related conditions, first trimester: Secondary | ICD-10-CM | POA: Insufficient documentation

## 2015-09-29 DIAGNOSIS — Z3A12 12 weeks gestation of pregnancy: Secondary | ICD-10-CM | POA: Insufficient documentation

## 2015-09-29 DIAGNOSIS — Z888 Allergy status to other drugs, medicaments and biological substances status: Secondary | ICD-10-CM | POA: Insufficient documentation

## 2015-09-29 DIAGNOSIS — K219 Gastro-esophageal reflux disease without esophagitis: Secondary | ICD-10-CM | POA: Insufficient documentation

## 2015-09-29 DIAGNOSIS — R0600 Dyspnea, unspecified: Secondary | ICD-10-CM | POA: Diagnosis not present

## 2015-09-29 DIAGNOSIS — O99611 Diseases of the digestive system complicating pregnancy, first trimester: Secondary | ICD-10-CM | POA: Insufficient documentation

## 2015-09-29 DIAGNOSIS — J45909 Unspecified asthma, uncomplicated: Secondary | ICD-10-CM | POA: Diagnosis not present

## 2015-09-29 DIAGNOSIS — O99511 Diseases of the respiratory system complicating pregnancy, first trimester: Secondary | ICD-10-CM | POA: Insufficient documentation

## 2015-09-29 DIAGNOSIS — R0602 Shortness of breath: Secondary | ICD-10-CM | POA: Diagnosis present

## 2015-09-29 MED ORDER — IPRATROPIUM-ALBUTEROL 0.5-2.5 (3) MG/3ML IN SOLN
3.0000 mL | Freq: Once | RESPIRATORY_TRACT | Status: AC
  Administered 2015-09-29: 3 mL via RESPIRATORY_TRACT
  Filled 2015-09-29: qty 3

## 2015-09-29 MED ORDER — GI COCKTAIL ~~LOC~~
30.0000 mL | Freq: Once | ORAL | Status: AC
  Administered 2015-09-29: 30 mL via ORAL
  Filled 2015-09-29: qty 30

## 2015-09-29 MED ORDER — PANTOPRAZOLE SODIUM 20 MG PO TBEC: 20.0000 mg | DELAYED_RELEASE_TABLET | Freq: Every day | ORAL | 1 refills | Status: DC

## 2015-09-29 NOTE — Progress Notes (Signed)
Fabian NovemberM Bhambri CNM in with portable u/s to view cardiac activity

## 2015-09-29 NOTE — MAU Note (Signed)
Have asthma and sometimes when my asthma gets bad I feel like this. Used inhaler twice today and not helping. Have had SOB several wks and trying to deal with it. Getting worse. When I lay down to sleep I have to gasp for breath. What has changed today is chest pain since 0900. Pain is sharp when I take a deep breath. Just sitting here I am ok as far as chest pain goes

## 2015-09-29 NOTE — Discharge Instructions (Signed)
Asthma, Adult Asthma is a recurring condition in which the airways tighten and narrow. Asthma can make it difficult to breathe. It can cause coughing, wheezing, and shortness of breath. Asthma episodes, also called asthma attacks, range from minor to life-threatening. Asthma cannot be cured, but medicines and lifestyle changes can help control it. CAUSES Asthma is believed to be caused by inherited (genetic) and environmental factors, but its exact cause is unknown. Asthma may be triggered by allergens, lung infections, or irritants in the air. Asthma triggers are different for each person. Common triggers include:   Animal dander.  Dust mites.  Cockroaches.  Pollen from trees or grass.  Mold.  Smoke.  Air pollutants such as dust, household cleaners, hair sprays, aerosol sprays, paint fumes, strong chemicals, or strong odors.  Cold air, weather changes, and winds (which increase molds and pollens in the air).  Strong emotional expressions such as crying or laughing hard.  Stress.  Certain medicines (such as aspirin) or types of drugs (such as beta-blockers).  Sulfites in foods and drinks. Foods and drinks that may contain sulfites include dried fruit, potato chips, and sparkling grape juice.  Infections or inflammatory conditions such as the flu, a cold, or an inflammation of the nasal membranes (rhinitis).  Gastroesophageal reflux disease (GERD).  Exercise or strenuous activity. SYMPTOMS Symptoms may occur immediately after asthma is triggered or many hours later. Symptoms include:  Wheezing.  Excessive nighttime or early morning coughing.  Frequent or severe coughing with a common cold.  Chest tightness.  Shortness of breath. DIAGNOSIS  The diagnosis of asthma is made by a review of your medical history and a physical exam. Tests may also be performed. These may include:  Lung function studies. These tests show how much air you breathe in and out.  Allergy  tests.  Imaging tests such as X-rays. TREATMENT  Asthma cannot be cured, but it can usually be controlled. Treatment involves identifying and avoiding your asthma triggers. It also involves medicines. There are 2 classes of medicine used for asthma treatment:   Controller medicines. These prevent asthma symptoms from occurring. They are usually taken every day.  Reliever or rescue medicines. These quickly relieve asthma symptoms. They are used as needed and provide short-term relief. Your health care provider will help you create an asthma action plan. An asthma action plan is a written plan for managing and treating your asthma attacks. It includes a list of your asthma triggers and how they may be avoided. It also includes information on when medicines should be taken and when their dosage should be changed. An action plan may also involve the use of a device called a peak flow meter. A peak flow meter measures how well the lungs are working. It helps you monitor your condition. HOME CARE INSTRUCTIONS   Take medicines only as directed by your health care provider. Speak with your health care provider if you have questions about how or when to take the medicines.  Use a peak flow meter as directed by your health care provider. Record and keep track of readings.  Understand and use the action plan to help minimize or stop an asthma attack without needing to seek medical care.  Control your home environment in the following ways to help prevent asthma attacks:  Do not smoke. Avoid being exposed to secondhand smoke.  Change your heating and air conditioning filter regularly.  Limit your use of fireplaces and wood stoves.  Get rid of pests (such as roaches  and mice) and their droppings.  Throw away plants if you see mold on them.  Clean your floors and dust regularly. Use unscented cleaning products.  Try to have someone else vacuum for you regularly. Stay out of rooms while they are  being vacuumed and for a short while afterward. If you vacuum, use a dust mask from a hardware store, a double-layered or microfilter vacuum cleaner bag, or a vacuum cleaner with a HEPA filter.  Replace carpet with wood, tile, or vinyl flooring. Carpet can trap dander and dust.  Use allergy-proof pillows, mattress covers, and box spring covers.  Wash bed sheets and blankets every week in hot water and dry them in a dryer.  Use blankets that are made of polyester or cotton.  Clean bathrooms and kitchens with bleach. If possible, have someone repaint the walls in these rooms with mold-resistant paint. Keep out of the rooms that are being cleaned and painted.  Wash hands frequently. SEEK MEDICAL CARE IF:   You have wheezing, shortness of breath, or a cough even if taking medicine to prevent attacks.  The colored mucus you cough up (sputum) is thicker than usual.  Your sputum changes from clear or white to yellow, green, gray, or bloody.  You have any problems that may be related to the medicines you are taking (such as a rash, itching, swelling, or trouble breathing).  You are using a reliever medicine more than 2-3 times per week.  Your peak flow is still at 50-79% of your personal best after following your action plan for 1 hour.  You have a fever. SEEK IMMEDIATE MEDICAL CARE IF:   You seem to be getting worse and are unresponsive to treatment during an asthma attack.  You are short of breath even at rest.  You get short of breath when doing very little physical activity.  You have difficulty eating, drinking, or talking due to asthma symptoms.  You develop chest pain.  You develop a fast heartbeat.  You have a bluish color to your lips or fingernails.  You are light-headed, dizzy, or faint.  Your peak flow is less than 50% of your personal best.   This information is not intended to replace advice given to you by your health care provider. Make sure you discuss any  questions you have with your health care provider.   Document Released: 01/17/2005 Document Revised: 10/08/2014 Document Reviewed: 08/16/2012 Elsevier Interactive Patient Education 2016 [employer] Gastroesophageal Reflux Disease, Adult Normally, food travels down the esophagus and stays in the stomach to be digested. However, when a person has gastroesophageal reflux disease (GERD), food and stomach acid move back up into the esophagus. When this happens, the esophagus becomes sore and inflamed. Over time, GERD can create small holes (ulcers) in the lining of the esophagus.  CAUSES This condition is caused by a problem with the muscle between the esophagus and the stomach (lower esophageal sphincter, or LES). Normally, the LES muscle closes after food passes through the esophagus to the stomach. When the LES is weakened or abnormal, it does not close properly, and that allows food and stomach acid to go back up into the esophagus. The LES can be weakened by certain dietary substances, medicines, and medical conditions, including:  Tobacco use.  Pregnancy.  Having a hiatal hernia.  Heavy alcohol use.  Certain foods and beverages, such as coffee, chocolate, onions, and peppermint. RISK FACTORS This condition is more likely to develop in:  People who have an increased  body weight.  People who have connective tissue disorders.  People who use NSAID medicines. SYMPTOMS Symptoms of this condition include:  Heartburn.  Difficult or painful swallowing.  The feeling of having a lump in the throat.  Abitter taste in the mouth.  Bad breath.  Having a large amount of saliva.  Having an upset or bloated stomach.  Belching.  Chest pain.  Shortness of breath or wheezing.  Ongoing (chronic) cough or a night-time cough.  Wearing away of tooth enamel.  Weight loss. Different conditions can cause chest pain. Make sure to see your health care provider if you experience chest  pain. DIAGNOSIS Your health care provider will take a medical history and perform a physical exam. To determine if you have mild or severe GERD, your health care provider may also monitor how you respond to treatment. You may also have other tests, including:  An endoscopy toexamine your stomach and esophagus with a small camera.  A test thatmeasures the acidity level in your esophagus.  A test thatmeasures how much pressure is on your esophagus.  A barium swallow or modified barium swallow to show the shape, size, and functioning of your esophagus. TREATMENT The goal of treatment is to help relieve your symptoms and to prevent complications. Treatment for this condition may vary depending on how severe your symptoms are. Your health care provider may recommend:  Changes to your diet.  Medicine.  Surgery. HOME CARE INSTRUCTIONS Diet  Follow a diet as recommended by your health care provider. This may involve avoiding foods and drinks such as:  Coffee and tea (with or without caffeine).  Drinks that containalcohol.  Energy drinks and sports drinks.  Carbonated drinks or sodas.  Chocolate and cocoa.  Peppermint and mint flavorings.  Garlic and onions.  Horseradish.  Spicy and acidic foods, including peppers, chili powder, curry powder, vinegar, hot sauces, and barbecue sauce.  Citrus fruit juices and citrus fruits, such as oranges, lemons, and limes.  Tomato-based foods, such as red sauce, chili, salsa, and pizza with red sauce.  Fried and fatty foods, such as donuts, french fries, potato chips, and high-fat dressings.  High-fat meats, such as hot dogs and fatty cuts of red and white meats, such as rib eye steak, sausage, ham, and bacon.  High-fat dairy items, such as whole milk, butter, and cream cheese.  Eat small, frequent meals instead of large meals.  Avoid drinking large amounts of liquid with your meals.  Avoid eating meals during the 2-3 hours before  bedtime.  Avoid lying down right after you eat.  Do not exercise right after you eat. General Instructions  Pay attention to any changes in your symptoms.  Take over-the-counter and prescription medicines only as told by your health care provider. Do not take aspirin, ibuprofen, or other NSAIDs unless your health care provider told you to do so.  Do not use any tobacco products, including cigarettes, chewing tobacco, and e-cigarettes. If you need help quitting, ask your health care provider.  Wear loose-fitting clothing. Do not wear anything tight around your waist that causes pressure on your abdomen.  Raise (elevate) the head of your bed 6 inches (15cm).  Try to reduce your stress, such as with yoga or meditation. If you need help reducing stress, ask your health care provider.  If you are overweight, reduce your weight to an amount that is healthy for you. Ask your health care provider for guidance about a safe weight loss goal.  Keep all follow-up  visits as told by your health care provider. This is important. SEEK MEDICAL CARE IF:  You have new symptoms.  You have unexplained weight loss.  You have difficulty swallowing, or it hurts to swallow.  You have wheezing or a persistent cough.  Your symptoms do not improve with treatment.  You have a hoarse voice. SEEK IMMEDIATE MEDICAL CARE IF:  You have pain in your arms, neck, jaw, teeth, or back.  You feel sweaty, dizzy, or light-headed.  You have chest pain or shortness of breath.  You vomit and your vomit looks like blood or coffee grounds.  You faint.  Your stool is bloody or black.  You cannot swallow, drink, or eat.   This information is not intended to replace advice given to you by your health care provider. Make sure you discuss any questions you have with your health care provider.   Document Released: 10/27/2004 Document Revised: 10/08/2014 Document Reviewed: 05/14/2014 Elsevier Interactive Patient  Education 2016 [employer]. Food Choices for Gastroesophageal Reflux Disease, Adult When you have gastroesophageal reflux disease (GERD), the foods you eat and your eating habits are very important. Choosing the right foods can help ease the discomfort of GERD. WHAT GENERAL GUIDELINES DO I NEED TO FOLLOW?  Choose fruits, vegetables, whole grains, low-fat dairy products, and low-fat meat, fish, and poultry.  Limit fats such as oils, salad dressings, butter, nuts, and avocado.  Keep a food diary to identify foods that cause symptoms.  Avoid foods that cause reflux. These may be different for different people.  Eat frequent small meals instead of three large meals each day.  Eat your meals slowly, in a relaxed setting.  Limit fried foods.  Cook foods using methods other than frying.  Avoid drinking alcohol.  Avoid drinking large amounts of liquids with your meals.  Avoid bending over or lying down until 2-3 hours after eating. WHAT FOODS ARE NOT RECOMMENDED? The following are some foods and drinks that may worsen your symptoms: Vegetables Tomatoes. Tomato juice. Tomato and spaghetti sauce. Chili peppers. Onion and garlic. Horseradish. Fruits Oranges, grapefruit, and lemon (fruit and juice). Meats High-fat meats, fish, and poultry. This includes hot dogs, ribs, ham, sausage, salami, and bacon. Dairy Whole milk and chocolate milk. Sour cream. Cream. Butter. Ice cream. Cream cheese.  Beverages Coffee and tea, with or without caffeine. Carbonated beverages or energy drinks. Condiments Hot sauce. Barbecue sauce.  Sweets/Desserts Chocolate and cocoa. Donuts. Peppermint and spearmint. Fats and Oils High-fat foods, including [place] fries and potato chips. Other Vinegar. Strong spices, such as black pepper, white pepper, red pepper, cayenne, curry powder, cloves, ginger, and chili powder. The items listed above may not be a complete list of foods and beverages to avoid. Contact  your dietitian for more information.   This information is not intended to replace advice given to you by your health care provider. Make sure you discuss any questions you have with your health care provider.   Document Released: 01/17/2005 Document Revised: 02/07/2014 Document Reviewed: 11/21/2012 Elsevier Interactive Patient Education [employer]

## 2015-09-29 NOTE — MAU Provider Note (Signed)
History     CSN: [id number]  Arrival date and time: 09/29/15 1514   First Provider Initiated Contact with Patient 09/29/15 1539      Chief Complaint  Patient presents with  . Shortness of Breath  . Chest Pain   G2P0101 @[redacted]w[redacted]d  c/o SOB and chest pressure x2 weeks. She reports more trouble with breathing at night and gasping at times. She also reports pain in chest with inspiration since yesterday. She has hx of asthma and been using inhaler more but it didn't give her relief today. She reports her asthma triggers are cold weather and illness but she denies recent allergies, cough, or cold. She denies VB or abdominal pain. She also endorses a hx of GERD prior to pregnancy and stopped Omeprazole because she didn't know if it was safe. She is starting prenatal care at [facility] in 3 days.     OB History    Gravida Para Term Preterm AB Living   2 1   1   1    SAB TAB Ectopic Multiple Live Births           1      Past Medical History:  Diagnosis Date  . Asthma   . Depression   . Migraine     Past Surgical History:  Procedure Laterality Date  . BARTHOLIN GLAND CYST EXCISION    . CESAREAN SECTION    . CHOLECYSTECTOMY      Family History  Problem Relation Age of Onset  . Diabetes Other   . Hypertension Other   . Cancer Other     Social History  Substance Use Topics  . Smoking status: Never Smoker  . Smokeless tobacco: Never Used  . Alcohol use No    Allergies:  Allergies  Allergen Reactions  . Sumatriptan Other (See Comments)    She felt "horribly blah like a zombie" and did not improve headache    Prescriptions Prior to Admission  Medication Sig Dispense Refill Last Dose  . acetaminophen (TYLENOL) 500 MG tablet Take 500 mg by mouth every 6 (six) hours as needed.   Past Week at Unknown time  . albuterol (PROVENTIL HFA) 108 (90 Base) MCG/ACT inhaler Inhale 2 puffs into the lungs every 4 (four) hours as needed for wheezing or shortness of breath.    09/29/2015 at  Unknown time  . Prenatal Vit-Fe Fumarate-FA (PRENATAL COMPLETE) 14-0.4 MG TABS Take 1 tablet by mouth daily. 30 each 0 09/29/2015 at Unknown time  . amitriptyline (ELAVIL) 25 MG tablet Take 25 mg by mouth daily.   08/12/2015 at Unknown time    Review of Systems  Constitutional: Negative.   HENT: Negative for congestion.   Respiratory: Positive for cough (intermittent, non-productive). Negative for shortness of breath.   Cardiovascular: Negative for chest pain and palpitations.  Gastrointestinal: Positive for heartburn.   Physical Exam   Blood pressure 131/74, pulse 97, temperature 97.9 F (36.6 C), resp. rate 18, height 5\' 1"  (1.549 m), weight 83.9 kg (185 lb), last menstrual period 07/01/2015, SpO2 100 %.  Physical Exam  Constitutional: She is oriented to person, place, and time. She appears well-developed and well-nourished.  HENT:  Head: Normocephalic and atraumatic.  Neck: Normal range of motion. Neck supple.  Cardiovascular: Normal rate and regular rhythm.   Respiratory: Breath sounds normal. No accessory muscle usage (mild). No tachypnea. No respiratory distress. She has no decreased breath sounds. She has no wheezes. She has no rhonchi. She has no rales. She exhibits no tenderness.  GI: Soft. She exhibits no distension. There is no tenderness.  Musculoskeletal: Normal range of motion.  Neurological: She is alert and oriented to person, place, and time.  Skin: Skin is warm and dry.  Psychiatric: She has a normal mood and affect.   FHR: 150 bpm by bedside sono  MAU Course  Procedures EKG-nml Duoneb nebulizer treatment GI Cocktail  MDM No evidence of cardiac etiology. No improvement after neb treatment but all lung fields were clear prior and this is unlikely cause of chest pressure. Some SOB can be physiologic to pregnancy. She reports some improvement after GI cocktail. Stable for discharge home.   Assessment and Plan   1. Gastroesophageal reflux during pregnancy in  first trimester, antepartum   2. Shortness of breath due to pregnancy, first trimester    Discharge home Protonix 20 mg po daily #30, refill x1 Albuterol MDI prn [facility] days as scheduled Return for worsening sx  Donette LarryMelanie Maddilyn Campus, CNM 09/29/2015, 3:49 PM

## 2015-09-29 NOTE — Progress Notes (Signed)
Written and verbal d/c instructions given and understanding voiced. 

## 2015-10-01 LAB — SICKLE CELL SCREEN: Sickle Cell Screen: NORMAL

## 2015-10-01 LAB — OB RESULTS CONSOLE HEPATITIS B SURFACE ANTIGEN: Hepatitis B Surface Ag: NEGATIVE

## 2015-10-01 LAB — OB RESULTS CONSOLE HIV ANTIBODY (ROUTINE TESTING): HIV: NONREACTIVE

## 2015-10-01 LAB — OB RESULTS CONSOLE RUBELLA ANTIBODY, IGM: Rubella: IMMUNE

## 2015-10-02 LAB — HEMOGLOBIN EVAL RFX ELECTROPHORESIS: HEMOGLOBIN EVALUATION: NORMAL

## 2015-10-12 ENCOUNTER — Encounter: Payer: Medicaid Other | Attending: Obstetrics and Gynecology | Admitting: Dietician

## 2015-10-12 ENCOUNTER — Inpatient Hospital Stay (HOSPITAL_COMMUNITY)
Admission: AD | Admit: 2015-10-12 | Discharge: 2015-10-12 | Disposition: A | Discharge status: Home / Self Care | Payer: Medicaid Other | Source: Ambulatory Visit | Attending: Family Medicine | Admitting: Family Medicine

## 2015-10-12 ENCOUNTER — Telehealth: Payer: Self-pay | Admitting: Student

## 2015-10-12 ENCOUNTER — Encounter (HOSPITAL_COMMUNITY): Payer: Self-pay

## 2015-10-12 ENCOUNTER — Ambulatory Visit: Payer: Medicaid Other | Admitting: *Deleted

## 2015-10-12 DIAGNOSIS — J029 Acute pharyngitis, unspecified: Secondary | ICD-10-CM | POA: Diagnosis present

## 2015-10-12 DIAGNOSIS — Z3A14 14 weeks gestation of pregnancy: Secondary | ICD-10-CM | POA: Insufficient documentation

## 2015-10-12 DIAGNOSIS — Z3A00 Weeks of gestation of pregnancy not specified: Secondary | ICD-10-CM | POA: Insufficient documentation

## 2015-10-12 DIAGNOSIS — R059 Cough, unspecified: Secondary | ICD-10-CM

## 2015-10-12 DIAGNOSIS — R05 Cough: Secondary | ICD-10-CM | POA: Diagnosis not present

## 2015-10-12 DIAGNOSIS — O26892 Other specified pregnancy related conditions, second trimester: Secondary | ICD-10-CM | POA: Insufficient documentation

## 2015-10-12 DIAGNOSIS — O24419 Gestational diabetes mellitus in pregnancy, unspecified control: Secondary | ICD-10-CM

## 2015-10-12 DIAGNOSIS — J069 Acute upper respiratory infection, unspecified: Secondary | ICD-10-CM

## 2015-10-12 HISTORY — DX: Gastro-esophageal reflux disease without esophagitis: K21.9

## 2015-10-12 HISTORY — DX: Anxiety disorder, unspecified: F41.9

## 2015-10-12 LAB — URINALYSIS, ROUTINE W REFLEX MICROSCOPIC
Bilirubin Urine: NEGATIVE
Glucose, UA: NEGATIVE mg/dL
Hgb urine dipstick: NEGATIVE
Ketones, ur: 40 mg/dL — AB
NITRITE: NEGATIVE
Protein, ur: NEGATIVE mg/dL
SPECIFIC GRAVITY, URINE: 1.02 (ref 1.005–1.030)
pH: 6 (ref 5.0–8.0)

## 2015-10-12 LAB — URINE MICROSCOPIC-ADD ON: RBC / HPF: NONE SEEN RBC/hpf (ref 0–5)

## 2015-10-12 LAB — CBC WITH DIFFERENTIAL/PLATELET
Basophils Absolute: 0 10*3/uL (ref 0.0–0.1)
Basophils Relative: 0 %
EOS PCT: 0 %
Eosinophils Absolute: 0.1 10*3/uL (ref 0.0–0.7)
HCT: 34.4 % — ABNORMAL LOW (ref 36.0–46.0)
Hemoglobin: 12.1 g/dL (ref 12.0–15.0)
LYMPHS ABS: 1.4 10*3/uL (ref 0.7–4.0)
LYMPHS PCT: 12 %
MCH: 28.5 pg (ref 26.0–34.0)
MCHC: 35.2 g/dL (ref 30.0–36.0)
MCV: 80.9 fL (ref 78.0–100.0)
MONO ABS: 0.5 10*3/uL (ref 0.1–1.0)
MONOS PCT: 4 %
Neutro Abs: 10 10*3/uL — ABNORMAL HIGH (ref 1.7–7.7)
Neutrophils Relative %: 84 %
PLATELETS: 205 10*3/uL (ref 150–400)
RBC: 4.25 MIL/uL (ref 3.87–5.11)
RDW: 13.4 % (ref 11.5–15.5)
WBC: 11.9 10*3/uL — ABNORMAL HIGH (ref 4.0–10.5)

## 2015-10-12 LAB — INFLUENZA PANEL BY PCR (TYPE A & B)
H1N1 flu by pcr: NOT DETECTED
INFLAPCR: NEGATIVE
Influenza B By PCR: NEGATIVE

## 2015-10-12 LAB — RAPID STREP SCREEN (MED CTR MEBANE ONLY): Streptococcus, Group A Screen (Direct): POSITIVE — AB

## 2015-10-12 MED ORDER — GLUCOSE BLOOD VI STRP: ORAL_STRIP | 12 refills | Status: DC

## 2015-10-12 MED ORDER — BENZONATATE 100 MG PO CAPS: 200.0000 mg | ORAL_CAPSULE | Freq: Three times a day (TID) | ORAL | 0 refills | Status: DC | PRN

## 2015-10-12 MED ORDER — BENZONATATE 100 MG PO CAPS
200.0000 mg | ORAL_CAPSULE | Freq: Once | ORAL | Status: AC
  Administered 2015-10-12: 200 mg via ORAL
  Filled 2015-10-12: qty 2

## 2015-10-12 MED ORDER — ACCU-CHEK SOFTCLIX LANCETS MISC: 12 refills | Status: DC

## 2015-10-12 MED ORDER — ACCU-CHEK AVIVA PLUS W/DEVICE KIT: 1.0000 | PACK | Freq: Four times a day (QID) | 0 refills | Status: DC

## 2015-10-12 MED ORDER — ACETAMINOPHEN 325 MG PO TABS
650.0000 mg | ORAL_TABLET | Freq: Once | ORAL | Status: AC
  Administered 2015-10-12: 650 mg via ORAL
  Filled 2015-10-12: qty 2

## 2015-10-12 MED ORDER — AZITHROMYCIN 250 MG PO TABS: ORAL_TABLET | ORAL | 0 refills | Status: DC

## 2015-10-12 NOTE — Telephone Encounter (Signed)
Informed patient of positive strep swab. Continue zithromax; if symptoms don't improve can call back for penicillin. Switch out toothbrush in 2 days. Pt verbalized understanding.   Judeth HornErin Chelsei Mcchesney, NP

## 2015-10-12 NOTE — MAU Provider Note (Signed)
History     CSN: [id number]  Arrival date and time: 10/12/15 1036   First Provider Initiated Contact with Patient 10/12/15 1320      Chief Complaint  Patient presents with  . Cough  . Fever  . Sore Throat   Sarah Contreras is a 30 y.o. G2P0101 at [redacted]w[redacted]d who presents with cough and sore throat. Had fever yesterday; highest temp was "nearly 103". Treated fever with tylenol which she last took yesterday.    Cough  This is a new problem. The current episode started yesterday. The problem has been unchanged. The cough is productive of purulent sputum. Associated symptoms include a fever (yesterday, temp up to 103), headaches, a sore throat and shortness of breath (occasional SOB). Pertinent negatives include no chest pain, chills, ear congestion, ear pain, hemoptysis, nasal congestion, postnasal drip or wheezing. Nothing aggravates the symptoms. She has tried a beta-agonist inhaler (used albuterol inhaler yesterday) for the symptoms. The treatment provided mild relief. Her past medical history is significant for asthma. There is no history of pneumonia.  Sore Throat   This is a new problem. Episode onset: 3 days ago. The problem has been unchanged. The maximum temperature recorded prior to her arrival was 102 - 102.9 F (yesterday, temp up to "nearly 103"). The fever has been present for less than 1 day. The pain is at a severity of 5/10. Associated symptoms include coughing, headaches and shortness of breath (occasional SOB). Pertinent negatives include no abdominal pain, congestion, drooling, ear pain, hoarse voice, plugged ear sensation, neck pain, trouble swallowing or vomiting. She has had no exposure to strep or mono. She has tried acetaminophen for the symptoms. The treatment provided no relief.   OB History    Gravida Para Term Preterm AB Living   2 1   1   1    SAB TAB Ectopic Multiple Live Births           1      Past Medical History:  Diagnosis Date  . Anxiety   . Asthma   .  Depression   . GERD (gastroesophageal reflux disease)   . Migraine     Past Surgical History:  Procedure Laterality Date  . BARTHOLIN GLAND CYST EXCISION    . CESAREAN SECTION    . CHOLECYSTECTOMY      Family History  Problem Relation Age of Onset  . Diabetes Other   . Hypertension Other   . Cancer Other     Social History  Substance Use Topics  . Smoking status: Never Smoker  . Smokeless tobacco: Never Used  . Alcohol use No    Allergies:  Allergies  Allergen Reactions  . Sumatriptan Other (See Comments)    She felt "horribly blah like a zombie" and did not improve headache    Prescriptions Prior to Admission  Medication Sig Dispense Refill Last Dose  . acetaminophen (TYLENOL) 500 MG tablet Take 500 mg by mouth every 6 (six) hours as needed for moderate pain.    Past Week at Unknown time  . albuterol (PROVENTIL HFA) 108 (90 Base) MCG/ACT inhaler Inhale 2 puffs into the lungs every 4 (four) hours as needed for wheezing or shortness of breath.    09/29/2015 at Unknown time  . pantoprazole (PROTONIX) 20 MG tablet Take 1 tablet (20 mg total) by mouth daily. 30 tablet 1   . Prenatal Vit-Fe Fumarate-FA (PRENATAL COMPLETE) 14-0.4 MG TABS Take 1 tablet by mouth daily. 30 each 0 09/29/2015 at Unknown  time    Review of Systems  Constitutional: Positive for fever (yesterday, temp up to 103). Negative for chills and diaphoresis.  HENT: Positive for sore throat. Negative for congestion, drooling, ear pain, hoarse voice, postnasal drip and trouble swallowing.   Respiratory: Positive for cough, sputum production and shortness of breath (occasional SOB). Negative for hemoptysis and wheezing.   Cardiovascular: Negative.  Negative for chest pain.  Gastrointestinal: Negative.  Negative for abdominal pain and vomiting.  Genitourinary: Negative.   Musculoskeletal: Negative for neck pain.  Neurological: Positive for headaches.   Physical Exam   Blood pressure 122/73, pulse 103,  temperature 98.4 F (36.9 C), temperature source Oral, resp. rate 20, last menstrual period 07/01/2015, SpO2 100 %.  Physical Exam  Nursing note and vitals reviewed. Constitutional: She is oriented to person, place, and time. She appears well-developed and well-nourished. No distress.  HENT:  Head: Normocephalic and atraumatic.  Right Ear: Tympanic membrane normal.  Left Ear: Tympanic membrane normal.  Nose: Rhinorrhea present. Right sinus exhibits no maxillary sinus tenderness and no frontal sinus tenderness. Left sinus exhibits no maxillary sinus tenderness and no frontal sinus tenderness.  Mouth/Throat: Posterior oropharyngeal erythema present. No oropharyngeal exudate or tonsillar abscesses.  Eyes: Conjunctivae are normal. Right eye exhibits no discharge. Left eye exhibits no discharge. No scleral icterus.  Neck: Normal range of motion.  Cardiovascular: Normal rate, regular rhythm and normal heart sounds.   No murmur heard. Respiratory: Effort normal and breath sounds normal. No respiratory distress. She has no wheezes.  Lymphadenopathy:       Head (right side): No submental and no submandibular adenopathy present.       Head (left side): No submental and no submandibular adenopathy present.  Neurological: She is alert and oriented to person, place, and time.  Skin: Skin is warm and dry. She is not diaphoretic.  Psychiatric: She has a normal mood and affect. Her behavior is normal. Judgment and thought content normal.    MAU Course  Procedures Results for orders placed or performed during the hospital encounter of 10/12/15 (from the past 24 hour(s))  Urinalysis, Routine w reflex microscopic (not at [facility])     Status: Abnormal   Collection Time: 10/12/15 11:23 AM  Result Value Ref Range   Color, Urine YELLOW YELLOW   APPearance HAZY (A) CLEAR   Specific Gravity, Urine 1.020 1.005 - 1.030   pH 6.0 5.0 - 8.0   Glucose, UA NEGATIVE NEGATIVE mg/dL   Hgb urine dipstick NEGATIVE  NEGATIVE   Bilirubin Urine NEGATIVE NEGATIVE   Ketones, ur 40 (A) NEGATIVE mg/dL   Protein, ur NEGATIVE NEGATIVE mg/dL   Nitrite NEGATIVE NEGATIVE   Leukocytes, UA SMALL (A) NEGATIVE  Urine microscopic-add on     Status: Abnormal   Collection Time: 10/12/15 11:23 AM  Result Value Ref Range   Squamous Epithelial / LPF 6-30 (A) NONE SEEN   WBC, UA 0-5 0 - 5 WBC/hpf   RBC / HPF NONE SEEN 0 - 5 RBC/hpf   Bacteria, UA MANY (A) NONE SEEN  CBC with Differential     Status: Abnormal   Collection Time: 10/12/15  1:56 PM  Result Value Ref Range   WBC 11.9 (H) 4.0 - 10.5 K/uL   RBC 4.25 3.87 - 5.11 MIL/uL   Hemoglobin 12.1 12.0 - 15.0 g/dL   HCT 40.9 (L) 81.1 - 91.4 %   MCV 80.9 78.0 - 100.0 fL   MCH 28.5 26.0 - 34.0 pg   MCHC 35.2  30.0 - 36.0 g/dL   RDW 69.613.4 29.511.5 - 28.415.5 %   Platelets 205 150 - 400 K/uL   Neutrophils Relative % 84 %   Neutro Abs 10.0 (H) 1.7 - 7.7 K/uL   Lymphocytes Relative 12 %   Lymphs Abs 1.4 0.7 - 4.0 K/uL   Monocytes Relative 4 %   Monocytes Absolute 0.5 0.1 - 1.0 K/uL   Eosinophils Relative 0 %   Eosinophils Absolute 0.1 0.0 - 0.7 K/uL   Basophils Relative 0 %   Basophils Absolute 0.0 0.0 - 0.1 K/uL    MDM FHT 156 by doppler VSS - afebrile Flu & strep swab collected Tessalon 200 mg PO & tylenol 650 mg PO Repeat VS wnl CBC wnl Assessment and Plan  A: 1. Acute upper respiratory infection   2. Cough     P: Discharge home Rx tessalon & zithromax Increase oral fluid intake Take tylenol prn fever and pain Call PCP for follow up If symptoms worsen go to Urgent care or ED Flu & strep swabs pending  Judeth Hornrin Katlin Ciszewski 10/12/2015, 1:19 PM

## 2015-10-12 NOTE — Progress Notes (Signed)
Diabetes Education: 10/12/15 Brailey is G2P1. She is [redacted]W[redacted]D, EDD: 04/07/16.  Has a 30 y/o daughter.   Has a strong family h/x of type 2 diabetes on both sides of her family. [facility].   Completed review of GDM and the implications for mother and baby. Reviewed the post delivery care measures to help with helping to prevent development of type 2 DM following GDM. Review of factors affecting blood glucose levels. Encouraged to walk 30 minutes daily. Review of blood glucose monitoring.  Currently receiving Medicaid.  Review of the Accu-Check Aviva Plus meter.  Will have nursing send in and electronic prescription for the meter kit, strips and lancets to the [facility] at [facility] and [employer]. Today, on checking a random glucose that is approximately 2 hrs post coffee with cream and 3 packs of sugar, glucose level is 117 mg/dl. Review of the GDM dietary recommendations for restricting carbs and the meal pattern recommendation. Stressed need to limit juice, sugar and concentrated sweets.  Encouraged including a protein source at all meals and snacks. Provided handout "Nutrition, Diabetes and Pregnancy", and the yellow carb counting card. Will plan to follow as needed. Maggie Aniel Hubble, RN, RD, LDN

## 2015-10-12 NOTE — MAU Note (Signed)
Pt C/O sore throat since Friday, cough with chest pain - productive of clear/green sputum, HA, body aches, fever of 103 yesterday, hasn't taken temp today.

## 2015-10-12 NOTE — Discharge Instructions (Signed)
Upper Respiratory Infection, Adult Most upper respiratory infections (URIs) are a viral infection of the air passages leading to the lungs. A URI affects the nose, throat, and upper air passages. The most common type of URI is nasopharyngitis and is typically referred to as "the common cold." URIs run their course and usually go away on their own. Most of the time, a URI does not require medical attention, but sometimes a bacterial infection in the upper airways can follow a viral infection. This is called a secondary infection. Sinus and middle ear infections are common types of secondary upper respiratory infections. Bacterial pneumonia can also complicate a URI. A URI can worsen asthma and chronic obstructive pulmonary disease (COPD). Sometimes, these complications can require emergency medical care and may be life threatening.  CAUSES Almost all URIs are caused by viruses. A virus is a type of germ and can spread from one person to another.  RISKS FACTORS You may be at risk for a URI if:   You smoke.   You have chronic heart or lung disease.  You have a weakened defense (immune) system.   You are very young or very old.   You have nasal allergies or asthma.  You work in crowded or poorly ventilated areas.  You work in health care facilities or schools. SIGNS AND SYMPTOMS  Symptoms typically develop 2-3 days after you come in contact with a cold virus. Most viral URIs last 7-10 days. However, viral URIs from the influenza virus (flu virus) can last 14-18 days and are typically more severe. Symptoms may include:   Runny or stuffy (congested) nose.   Sneezing.   Cough.   Sore throat.   Headache.   Fatigue.   Fever.   Loss of appetite.   Pain in your forehead, behind your eyes, and over your cheekbones (sinus pain).  Muscle aches.  DIAGNOSIS  Your health care provider may diagnose a URI by:  Physical exam.  Tests to check that your symptoms are not due to  another condition such as:  Strep throat.  Sinusitis.  Pneumonia.  Asthma. TREATMENT  A URI goes away on its own with time. It cannot be cured with medicines, but medicines may be prescribed or recommended to relieve symptoms. Medicines may help:  Reduce your fever.  Reduce your cough.  Relieve nasal congestion. HOME CARE INSTRUCTIONS   Take medicines only as directed by your health care provider.   Gargle warm saltwater or take cough drops to comfort your throat as directed by your health care provider.  Use a warm mist humidifier or inhale steam from a shower to increase air moisture. This may make it easier to breathe.  Drink enough fluid to keep your urine clear or pale yellow.   Eat soups and other clear broths and maintain good nutrition.   Rest as needed.   Return to work when your temperature has returned to normal or as your health care provider advises. You may need to stay home longer to avoid infecting others. You can also use a face mask and careful hand washing to prevent spread of the virus.  Increase the usage of your inhaler if you have asthma.   Do not use any tobacco products, including cigarettes, chewing tobacco, or electronic cigarettes. If you need help quitting, ask your health care provider. PREVENTION  The best way to protect yourself from getting a cold is to practice good hygiene.   Avoid oral or hand contact with people with cold  symptoms.   °· Wash your hands often if contact occurs.   °There is no clear evidence that vitamin C, vitamin E, echinacea, or exercise reduces the chance of developing a cold. However, it is always recommended to get plenty of rest, exercise, and practice good nutrition.  °SEEK MEDICAL CARE IF:  °· You are getting worse rather than better.   °· Your symptoms are not controlled by medicine.   °· You have chills. °· You have worsening shortness of breath. °· You have [place] or red mucus. °· You have yellow or [place] nasal  discharge. °· You have pain in your face, especially when you bend forward. °· You have a fever. °· You have swollen neck glands. °· You have pain while swallowing. °· You have white areas in the back of your throat. °SEEK IMMEDIATE MEDICAL CARE IF:  °· You have severe or persistent: °¨ Headache. °¨ Ear pain. °¨ Sinus pain. °¨ Chest pain. °· You have chronic lung disease and any of the following: °¨ Wheezing. °¨ Prolonged cough. °¨ Coughing up blood. °¨ A change in your usual mucus. °· You have a stiff neck. °· You have changes in your: °¨ Vision. °¨ Hearing. °¨ Thinking. °¨ Mood. °MAKE SURE YOU:  °· Understand these instructions. °· Will watch your condition. °· Will get help right away if you are not doing well or get worse. °  °This information is not intended to replace advice given to you by your health care provider. Make sure you discuss any questions you have with your health care provider. °  °Document Released: 07/13/2000 Document Revised: 06/03/2014 Document Reviewed: 04/24/2013 °Elsevier Interactive Patient Education ©2016 [employer] ° ° ° ° °Safe Medications in Pregnancy  ° °Acne: °Benzoyl Peroxide °Salicylic Acid ° °Backache/Headache: °Tylenol: 2 regular strength every 4 hours OR °             2 Extra strength every 6 hours ° °Colds/Coughs/Allergies: °Benadryl (alcohol free) 25 mg every 6 hours as needed °Breath right strips °Claritin °Cepacol throat lozenges °Chloraseptic throat spray °Cold-Eeze- up to three times per day °Cough drops, alcohol free °Flonase (by prescription only) °Guaifenesin °Mucinex °Robitussin DM (plain only, alcohol free) °Saline nasal spray/drops °Sudafed (pseudoephedrine) & Actifed ** use only after [redacted] weeks gestation and if you do not have high blood pressure °Tylenol °Vicks Vaporub °Zinc lozenges °Zyrtec  ° °Constipation: °Colace °Ducolax suppositories °Fleet enema °Glycerin suppositories °Metamucil °Milk of magnesia °Miralax °Senokot °Smooth move  tea ° °Diarrhea: °Kaopectate °Imodium A-D ° °*NO pepto Bismol ° °Hemorrhoids: °Anusol °Anusol HC °Preparation H °Tucks ° °Indigestion: °Tums °Maalox °Mylanta °Zantac  °Pepcid ° °Insomnia: °Benadryl (alcohol free) 25mg every 6 hours as needed °Tylenol PM °Unisom, no Gelcaps ° °Leg Cramps: °Tums °MagGel ° °Nausea/Vomiting:  °Bonine °Dramamine °Emetrol °Ginger extract °Sea bands °Meclizine  °Nausea medication to take during pregnancy:  °Unisom (doxylamine succinate 25 mg tablets) Take one tablet daily at bedtime. If symptoms are not adequately controlled, the dose can be increased to a maximum recommended dose of two tablets daily (1/2 tablet in the morning, 1/2 tablet mid-afternoon and one at bedtime). °Vitamin B6 100mg tablets. Take one tablet twice a day (up to 200 mg per day). ° °Skin Rashes: °Aveeno products °Benadryl cream or 25mg every 6 hours as needed °Calamine Lotion °1% cortisone cream ° °Yeast infection: °Gyne-lotrimin 7 °Monistat 7 ° °Gum/tooth pain: °Anbesol ° °**If taking multiple medications, please check labels to avoid duplicating the same active ingredients °**take medication as directed on the label °**   Do not exceed 4000 mg of tylenol in 24 hours **Do not take medications that contain aspirin or ibuprofen

## 2015-10-15 ENCOUNTER — Encounter: Payer: Self-pay | Admitting: Obstetrics and Gynecology

## 2015-10-15 ENCOUNTER — Ambulatory Visit (INDEPENDENT_AMBULATORY_CARE_PROVIDER_SITE_OTHER): Payer: Medicaid Other | Admitting: Obstetrics and Gynecology

## 2015-10-15 DIAGNOSIS — Z98891 History of uterine scar from previous surgery: Secondary | ICD-10-CM | POA: Insufficient documentation

## 2015-10-15 DIAGNOSIS — O24410 Gestational diabetes mellitus in pregnancy, diet controlled: Secondary | ICD-10-CM

## 2015-10-15 DIAGNOSIS — O34219 Maternal care for unspecified type scar from previous cesarean delivery: Secondary | ICD-10-CM

## 2015-10-15 DIAGNOSIS — O09212 Supervision of pregnancy with history of pre-term labor, second trimester: Secondary | ICD-10-CM

## 2015-10-15 DIAGNOSIS — O09219 Supervision of pregnancy with history of pre-term labor, unspecified trimester: Secondary | ICD-10-CM

## 2015-10-15 DIAGNOSIS — O09299 Supervision of pregnancy with other poor reproductive or obstetric history, unspecified trimester: Secondary | ICD-10-CM | POA: Insufficient documentation

## 2015-10-15 DIAGNOSIS — O0990 Supervision of high risk pregnancy, unspecified, unspecified trimester: Secondary | ICD-10-CM | POA: Insufficient documentation

## 2015-10-15 DIAGNOSIS — O09892 Supervision of other high risk pregnancies, second trimester: Secondary | ICD-10-CM

## 2015-10-15 DIAGNOSIS — O09899 Supervision of other high risk pregnancies, unspecified trimester: Secondary | ICD-10-CM | POA: Insufficient documentation

## 2015-10-15 DIAGNOSIS — O24419 Gestational diabetes mellitus in pregnancy, unspecified control: Secondary | ICD-10-CM | POA: Insufficient documentation

## 2015-10-15 DIAGNOSIS — O0992 Supervision of high risk pregnancy, unspecified, second trimester: Secondary | ICD-10-CM

## 2015-10-15 DIAGNOSIS — O09292 Supervision of pregnancy with other poor reproductive or obstetric history, second trimester: Secondary | ICD-10-CM

## 2015-10-15 LAB — COMPREHENSIVE METABOLIC PANEL
ALK PHOS: 58 U/L (ref 33–115)
ALT: 10 U/L (ref 6–29)
AST: 13 U/L (ref 10–30)
Albumin: 3.4 g/dL — ABNORMAL LOW (ref 3.6–5.1)
BUN: 7 mg/dL (ref 7–25)
CO2: 25 mmol/L (ref 20–31)
Calcium: 9 mg/dL (ref 8.6–10.2)
Chloride: 105 mmol/L (ref 98–110)
Creat: 0.63 mg/dL (ref 0.50–1.10)
GLUCOSE: 82 mg/dL (ref 65–99)
POTASSIUM: 4.3 mmol/L (ref 3.5–5.3)
SODIUM: 137 mmol/L (ref 135–146)
Total Bilirubin: 0.2 mg/dL (ref 0.2–1.2)
Total Protein: 6.2 g/dL (ref 6.1–8.1)

## 2015-10-15 LAB — HEMOGLOBIN A1C
Hgb A1c MFr Bld: 5.1 % (ref <5.7)
Mean Plasma Glucose: 100 mg/dL

## 2015-10-15 LAB — TSH: TSH: 0.66 mIU/L

## 2015-10-15 LAB — CBC
HCT: 36.8 % (ref 35.0–45.0)
Hemoglobin: 12.3 g/dL (ref 11.7–15.5)
MCH: 27.9 pg (ref 27.0–33.0)
MCHC: 33.4 g/dL (ref 32.0–36.0)
MCV: 83.4 fL (ref 80.0–100.0)
MPV: 9.7 fL (ref 7.5–12.5)
PLATELETS: 241 10*3/uL (ref 140–400)
RBC: 4.41 MIL/uL (ref 3.80–5.10)
RDW: 13.4 % (ref 11.0–15.0)
WBC: 10.4 10*3/uL (ref 3.8–10.8)

## 2015-10-15 LAB — POCT URINALYSIS DIP (DEVICE)
Bilirubin Urine: NEGATIVE
GLUCOSE, UA: NEGATIVE mg/dL
Hgb urine dipstick: NEGATIVE
KETONES UR: NEGATIVE mg/dL
Nitrite: NEGATIVE
PROTEIN: NEGATIVE mg/dL
SPECIFIC GRAVITY, URINE: 1.02 (ref 1.005–1.030)
Urobilinogen, UA: 0.2 mg/dL (ref 0.0–1.0)
pH: 7 (ref 5.0–8.0)

## 2015-10-15 MED ORDER — ASPIRIN EC 81 MG PO TBEC: 81.0000 mg | DELAYED_RELEASE_TABLET | Freq: Every day | ORAL | 6 refills | Status: DC

## 2015-10-15 NOTE — Progress Notes (Signed)
  Subjective:    Sarah Contreras is a [id number] 27w0dbeing seen today for her first obstetrical visit.  Her obstetrical history is significant for obesity, GDM (early diagnosis), previous classical cesarean section (secondary to breech in labor at 27 weeks) and history of preterm labor at 27 weeks. Patient does intend to breast feed. Pregnancy history fully reviewed.  Patient reports upper respiratory infection.  Vitals:   10/15/15 0926  BP: 117/70  Pulse: 84  Weight: 187 lb 8 oz (85 kg)    HISTORY: OB History  Gravida Para Term Preterm AB Living  _0 SAB TAB Ectopic Multiple Live Births          1    # Outcome Date GA Lbr Len/2nd Weight Sex Delivery Anes PTL Lv  2 Gravida           1 Preterm 10/31/06    F CS-LTranv   LIV     Past Medical History:  Diagnosis Date  . Anxiety   . Asthma   . Depression   . GERD (gastroesophageal reflux disease)   . Migraine    Past Surgical History:  Procedure Laterality Date  . BARTHOLIN GLAND CYST EXCISION    . CESAREAN SECTION    . CHOLECYSTECTOMY     Family History  Problem Relation Age of Onset  . Diabetes Other   . Hypertension Other   . Cancer Other      Exam       Assessment:    Pregnancy: G2P0101 Patient Active Problem List   Diagnosis Date Noted  . Supervision of high risk pregnancy, antepartum 10/15/2015  . H/O preterm delivery, currently pregnant 10/15/2015  . Previous cesarean section complicating pregnancy, antepartum condition or complication 083/29/1916 . Gestational diabetes mellitus (GDM), antepartum 10/15/2015        Plan:     Initial labs drawn. Prenatal vitamins. Problem list reviewed and updated. Genetic Screening discussed Quad Screen: ordered.  Ultrasound discussed; fetal survey: ordered. Discussed the benefits of weekly injections of 17-P in decreasing the risk of preterm delivery. Patient desires to receive it. Patient understands that it will be started at 16 weeks Discussed  taking daily dose of ASA Patient has met with diabetic educator. She has not gotten her meter yet but plans to do so today Discussed the implication of uncontrolled diabetes in pregnancy including but not limited to risks of fetal macrosomia, neonatal hypoglycemia, IUFD, and neonatal demise Health department chart mentioned a history of pre-eclampsia with last pregnancy but delivery record and discharge summary does not mention pre-eclampsia but rather preterm labor  Follow up in 2 weeks. 50% of 30 min visit spent on counseling and coordination of care.     Sarah Contreras 10/15/2015

## 2015-10-15 NOTE — Patient Instructions (Signed)
Contraception Choices Contraception (birth control) is the use of any methods or devices to prevent pregnancy. Below are some methods to help avoid pregnancy. HORMONAL METHODS   Contraceptive implant. This is a thin, plastic tube containing progesterone hormone. It does not contain estrogen hormone. Your health care provider inserts the tube in the inner part of the upper arm. The tube can remain in place for up to 3 years. After 3 years, the implant must be removed. The implant prevents the ovaries from releasing an egg (ovulation), thickens the cervical mucus to prevent sperm from entering the uterus, and thins the lining of the inside of the uterus.  Progesterone-only injections. These injections are given every 3 months by your health care provider to prevent pregnancy. This synthetic progesterone hormone stops the ovaries from releasing eggs. It also thickens cervical mucus and changes the uterine lining. This makes it harder for sperm to survive in the uterus.  Birth control pills. These pills contain estrogen and progesterone hormone. They work by preventing the ovaries from releasing eggs (ovulation). They also cause the cervical mucus to thicken, preventing the sperm from entering the uterus. Birth control pills are prescribed by a health care provider.Birth control pills can also be used to treat heavy periods.  Minipill. This type of birth control pill contains only the progesterone hormone. They are taken every day of each month and must be prescribed by your health care provider.  Birth control patch. The patch contains hormones similar to those in birth control pills. It must be changed once a week and is prescribed by a health care provider.  Vaginal ring. The ring contains hormones similar to those in birth control pills. It is left in the vagina for 3 weeks, removed for 1 week, and then a new one is put back in place. The patient must be comfortable inserting and removing the ring  from the vagina.A health care provider's prescription is necessary.  Emergency contraception. Emergency contraceptives prevent pregnancy after unprotected sexual intercourse. This pill can be taken right after sex or up to 5 days after unprotected sex. It is most effective the sooner you take the pills after having sexual intercourse. Most emergency contraceptive pills are available without a prescription. Check with your pharmacist. Do not use emergency contraception as your only form of birth control. BARRIER METHODS   Female condom. This is a thin sheath (latex or rubber) that is worn over the penis during sexual intercourse. It can be used with spermicide to increase effectiveness.  Female condom. This is a soft, loose-fitting sheath that is put into the vagina before sexual intercourse.  Diaphragm. This is a soft, latex, dome-shaped barrier that must be fitted by a health care provider. It is inserted into the vagina, along with a spermicidal jelly. It is inserted before intercourse. The diaphragm should be left in the vagina for 6 to 8 hours after intercourse.  Cervical cap. This is a round, soft, latex or plastic cup that fits over the cervix and must be fitted by a health care provider. The cap can be left in place for up to 48 hours after intercourse.  Sponge. This is a soft, circular piece of polyurethane foam. The sponge has spermicide in it. It is inserted into the vagina after wetting it and before sexual intercourse.  Spermicides. These are chemicals that kill or block sperm from entering the cervix and uterus. They come in the form of creams, jellies, suppositories, foam, or tablets. They do not require a   prescription. They are inserted into the vagina with an applicator before having sexual intercourse. The process must be repeated every time you have sexual intercourse. INTRAUTERINE CONTRACEPTION  Intrauterine device (IUD). This is a T-shaped device that is put in a woman's uterus  during a menstrual period to prevent pregnancy. There are 2 types:  Copper IUD. This type of IUD is wrapped in copper wire and is placed inside the uterus. Copper makes the uterus and fallopian tubes produce a fluid that kills sperm. It can stay in place for 10 years.  Hormone IUD. This type of IUD contains the hormone progestin (synthetic progesterone). The hormone thickens the cervical mucus and prevents sperm from entering the uterus, and it also thins the uterine lining to prevent implantation of a fertilized egg. The hormone can weaken or kill the sperm that get into the uterus. It can stay in place for 3-5 years, depending on which type of IUD is used. PERMANENT METHODS OF CONTRACEPTION  Female tubal ligation. This is when the woman's fallopian tubes are surgically sealed, tied, or blocked to prevent the egg from traveling to the uterus.  Hysteroscopic sterilization. This involves placing a small coil or insert into each fallopian tube. Your doctor uses a technique called hysteroscopy to do the procedure. The device causes scar tissue to form. This results in permanent blockage of the fallopian tubes, so the sperm cannot fertilize the egg. It takes about 3 months after the procedure for the tubes to become blocked. You must use another form of birth control for these 3 months.  Female sterilization. This is when the female has the tubes that carry sperm tied off (vasectomy).This blocks sperm from entering the vagina during sexual intercourse. After the procedure, the man can still ejaculate fluid (semen). NATURAL PLANNING METHODS  Natural family planning. This is not having sexual intercourse or using a barrier method (condom, diaphragm, cervical cap) on days the woman could become pregnant.  Calendar method. This is keeping track of the length of each menstrual cycle and identifying when you are fertile.  Ovulation method. This is avoiding sexual intercourse during ovulation.  Symptothermal  method. This is avoiding sexual intercourse during ovulation, using a thermometer and ovulation symptoms.  Post-ovulation method. This is timing sexual intercourse after you have ovulated. Regardless of which type or method of contraception you choose, it is important that you use condoms to protect against the transmission of sexually transmitted infections (STIs). Talk with your health care provider about which form of contraception is most appropriate for you.   This information is not intended to replace advice given to you by your health care provider. Make sure you discuss any questions you have with your health care provider.   Document Released: 01/17/2005 Document Revised: 01/22/2013 Document Reviewed: 07/12/2012 Elsevier Interactive Patient Education 2016 [employer]  

## 2015-10-16 LAB — PROTEIN / CREATININE RATIO, URINE
CREATININE, URINE: 156 mg/dL (ref 20–320)
Protein Creatinine Ratio: 90 mg/g creat (ref 21–161)
TOTAL PROTEIN, URINE: 14 mg/dL (ref 5–24)

## 2015-10-23 ENCOUNTER — Telehealth: Payer: Self-pay | Admitting: *Deleted

## 2015-10-23 NOTE — Telephone Encounter (Signed)
Received makena for Sarah Contreras. I called her to let her know we received makena and would like to start injections as soon as possible since she is [redacted] weeks pregnant. She states can't come in today before noon, agreed to Monday at 1pm.

## 2015-10-26 ENCOUNTER — Ambulatory Visit (INDEPENDENT_AMBULATORY_CARE_PROVIDER_SITE_OTHER): Payer: Medicaid Other | Admitting: *Deleted

## 2015-10-26 DIAGNOSIS — O09212 Supervision of pregnancy with history of pre-term labor, second trimester: Secondary | ICD-10-CM

## 2015-10-26 DIAGNOSIS — O09892 Supervision of other high risk pregnancies, second trimester: Secondary | ICD-10-CM

## 2015-10-26 MED ORDER — HYDROXYPROGESTERONE CAPROATE 250 MG/ML IM OIL
250.0000 mg | TOPICAL_OIL | Freq: Once | INTRAMUSCULAR | Status: AC
  Administered 2015-10-26: 250 mg via INTRAMUSCULAR

## 2015-11-02 ENCOUNTER — Ambulatory Visit: Payer: Medicaid Other

## 2015-11-03 ENCOUNTER — Ambulatory Visit (INDEPENDENT_AMBULATORY_CARE_PROVIDER_SITE_OTHER): Payer: Medicaid Other | Admitting: Advanced Practice Midwife

## 2015-11-03 ENCOUNTER — Ambulatory Visit (INDEPENDENT_AMBULATORY_CARE_PROVIDER_SITE_OTHER): Payer: Medicaid Other | Admitting: Clinical

## 2015-11-03 VITALS — BP 119/76 | HR 71 | Ht 61.0 in | Wt 187.0 lb

## 2015-11-03 DIAGNOSIS — F331 Major depressive disorder, recurrent, moderate: Secondary | ICD-10-CM

## 2015-11-03 DIAGNOSIS — O09212 Supervision of pregnancy with history of pre-term labor, second trimester: Secondary | ICD-10-CM | POA: Diagnosis present

## 2015-11-03 DIAGNOSIS — O99342 Other mental disorders complicating pregnancy, second trimester: Secondary | ICD-10-CM

## 2015-11-03 DIAGNOSIS — F32A Depression, unspecified: Secondary | ICD-10-CM

## 2015-11-03 DIAGNOSIS — F329 Major depressive disorder, single episode, unspecified: Secondary | ICD-10-CM

## 2015-11-03 DIAGNOSIS — O09892 Supervision of other high risk pregnancies, second trimester: Secondary | ICD-10-CM

## 2015-11-03 MED ORDER — SERTRALINE HCL 50 MG PO TABS: 50.0000 mg | ORAL_TABLET | Freq: Every day | ORAL | 1 refills | Status: DC

## 2015-11-03 MED ORDER — HYDROXYPROGESTERONE CAPROATE 250 MG/ML IM OIL
250.0000 mg | TOPICAL_OIL | Freq: Once | INTRAMUSCULAR | Status: AC
  Administered 2015-11-03: 250 mg via INTRAMUSCULAR

## 2015-11-03 NOTE — Progress Notes (Signed)
Pt in office today for Makena injection.  She reported to RN that her depression is worsening and she feels like she needs medication. She was on antidepressants prior to pregnancy but has stopped taking them.  She spoke with Asher MuirJamie today in the office and has received printed education about antidepressants in pregnancy.    I spoke with the patient about risks and benefits of antidepressants in pregnancy. Also discussed side effects of medication, including increased risk of suicide before medications are starting to help depression. Pt aware and denies suicidal ideations today.  Pt states understanding and would like to start Zoloft today.  She plans to continue speaking to McClureJamie and will consider setting up outpatient counseling as well.    Zoloft 50 mg daily to start today. F/U as scheduled next week.   LEFTWICH-KIRBY, Misty StanleyLISA, CNM 12:33 PM

## 2015-11-03 NOTE — BH Specialist Note (Signed)
  ASSESSMENT: Pt currently experiencing Major depressive disorder, moderate, currently active. [facility] Pt would benefit from psychoeducation and brief therapeutic intervention regarding coping with symptoms of depression. Stage of Change: contemplative  PLAN: 1. F/U with behavioral health clinician in two weeks, or at next medical appointment 2. Psychiatric Medications: Zoloft, begin today 3. Behavioral recommendations:   -Pick up Zoloft at pharmacy today -Read educational material regarding coping with symptoms of depression(and insomnia) -Walk 15 minutes/day with partner -Continue using music at night to sleep -Consider playing uplifting music throughout the day, as needed, to lift mood   SUBJECTIVE: Pt. referred by Sharen CounterLisa Leftwich-Kirby, CNM for symptoms of depression:  Pt. reports the following symptoms/concerns: Pt states that she is feeling an increase of depressive symptoms that have been continual for at least 5 years, with most pressing concern as lack of interest, and "insomnia". Pt copes by listening to music before sleep, and wants to start back on antidepressants asap.  Duration of problem: over 5 years; increasing in over one month Severity: moderate   OBJECTIVE: Orientation & Cognition: Oriented x3. Thought processes normal and appropriate to situation. Mood: depressed. Affect: appropriate Appearance: appropriate Risk of harm to self or others: no known risk of harm to self or others, no SI, no HI Substance use: none Assessments administered: none formal today  Diagnosis: Major depressive disorder, moderate, currently active, recurrent CPT Code: F33.1  -------------------------------------------- Other(s) present in the room: partner  Time spent with patient in exam room: 30  Minutes11:30-12:00  Visit number: 1st Intervention: MI

## 2015-11-03 NOTE — Addendum Note (Signed)
Addended by: Sharen CounterLEFTWICH-KIRBY, LISA A on: 11/03/2015 12:34 PM   Modules accepted: Orders

## 2015-11-05 ENCOUNTER — Encounter (HOSPITAL_COMMUNITY): Payer: Self-pay | Admitting: Obstetrics and Gynecology

## 2015-11-06 ENCOUNTER — Encounter: Payer: Self-pay | Admitting: *Deleted

## 2015-11-12 ENCOUNTER — Other Ambulatory Visit: Payer: Self-pay | Admitting: Obstetrics and Gynecology

## 2015-11-12 ENCOUNTER — Encounter (HOSPITAL_COMMUNITY): Payer: Self-pay

## 2015-11-12 ENCOUNTER — Institutional Professional Consult (permissible substitution): Payer: Medicaid Other

## 2015-11-12 ENCOUNTER — Other Ambulatory Visit (HOSPITAL_COMMUNITY): Payer: Self-pay | Admitting: *Deleted

## 2015-11-12 ENCOUNTER — Ambulatory Visit (INDEPENDENT_AMBULATORY_CARE_PROVIDER_SITE_OTHER): Payer: Medicaid Other | Admitting: Obstetrics and Gynecology

## 2015-11-12 ENCOUNTER — Ambulatory Visit (HOSPITAL_COMMUNITY)
Admission: RE | Admit: 2015-11-12 | Discharge: 2015-11-12 | Disposition: A | Discharge status: Home / Self Care | Payer: Medicaid Other | Source: Ambulatory Visit | Attending: Obstetrics and Gynecology | Admitting: Obstetrics and Gynecology

## 2015-11-12 VITALS — BP 119/79 | HR 89 | Wt 186.9 lb

## 2015-11-12 DIAGNOSIS — F419 Anxiety disorder, unspecified: Secondary | ICD-10-CM | POA: Insufficient documentation

## 2015-11-12 DIAGNOSIS — O24419 Gestational diabetes mellitus in pregnancy, unspecified control: Secondary | ICD-10-CM | POA: Insufficient documentation

## 2015-11-12 DIAGNOSIS — O99342 Other mental disorders complicating pregnancy, second trimester: Secondary | ICD-10-CM

## 2015-11-12 DIAGNOSIS — O34219 Maternal care for unspecified type scar from previous cesarean delivery: Secondary | ICD-10-CM

## 2015-11-12 DIAGNOSIS — F32A Depression, unspecified: Secondary | ICD-10-CM | POA: Insufficient documentation

## 2015-11-12 DIAGNOSIS — Z3A19 19 weeks gestation of pregnancy: Secondary | ICD-10-CM

## 2015-11-12 DIAGNOSIS — O09219 Supervision of pregnancy with history of pre-term labor, unspecified trimester: Secondary | ICD-10-CM

## 2015-11-12 DIAGNOSIS — B379 Candidiasis, unspecified: Secondary | ICD-10-CM | POA: Insufficient documentation

## 2015-11-12 DIAGNOSIS — T3695XA Adverse effect of unspecified systemic antibiotic, initial encounter: Secondary | ICD-10-CM

## 2015-11-12 DIAGNOSIS — Z62810 Personal history of physical and sexual abuse in childhood: Secondary | ICD-10-CM

## 2015-11-12 DIAGNOSIS — Z1389 Encounter for screening for other disorder: Secondary | ICD-10-CM

## 2015-11-12 DIAGNOSIS — O99212 Obesity complicating pregnancy, second trimester: Secondary | ICD-10-CM | POA: Insufficient documentation

## 2015-11-12 DIAGNOSIS — O0990 Supervision of high risk pregnancy, unspecified, unspecified trimester: Secondary | ICD-10-CM

## 2015-11-12 DIAGNOSIS — F329 Major depressive disorder, single episode, unspecified: Secondary | ICD-10-CM

## 2015-11-12 DIAGNOSIS — Z23 Encounter for immunization: Secondary | ICD-10-CM

## 2015-11-12 DIAGNOSIS — O09212 Supervision of pregnancy with history of pre-term labor, second trimester: Secondary | ICD-10-CM | POA: Insufficient documentation

## 2015-11-12 DIAGNOSIS — O24410 Gestational diabetes mellitus in pregnancy, diet controlled: Secondary | ICD-10-CM

## 2015-11-12 DIAGNOSIS — O0992 Supervision of high risk pregnancy, unspecified, second trimester: Secondary | ICD-10-CM

## 2015-11-12 DIAGNOSIS — O09899 Supervision of other high risk pregnancies, unspecified trimester: Secondary | ICD-10-CM

## 2015-11-12 DIAGNOSIS — O99340 Other mental disorders complicating pregnancy, unspecified trimester: Secondary | ICD-10-CM

## 2015-11-12 DIAGNOSIS — IMO0002 Reserved for concepts with insufficient information to code with codable children: Secondary | ICD-10-CM

## 2015-11-12 MED ORDER — HYDROXYPROGESTERONE CAPROATE 250 MG/ML IM OIL
250.0000 mg | TOPICAL_OIL | Freq: Once | INTRAMUSCULAR | Status: AC
  Administered 2015-11-12: 250 mg via INTRAMUSCULAR

## 2015-11-12 MED ORDER — TERCONAZOLE 0.8 % VA CREA: 1.0000 | TOPICAL_CREAM | Freq: Every day | VAGINAL | 0 refills | Status: AC

## 2015-11-12 NOTE — Addendum Note (Signed)
Addended by: Faythe CasaBELLAMY, JEANETTA M on: 11/12/2015 01:11 PM   Modules accepted: Orders
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
## 2015-11-17 ENCOUNTER — Ambulatory Visit (INDEPENDENT_AMBULATORY_CARE_PROVIDER_SITE_OTHER): Payer: Medicaid Other | Admitting: *Deleted

## 2015-11-17 VITALS — BP 128/71 | HR 88

## 2015-11-17 DIAGNOSIS — O09212 Supervision of pregnancy with history of pre-term labor, second trimester: Secondary | ICD-10-CM | POA: Diagnosis present

## 2015-11-17 DIAGNOSIS — O09219 Supervision of pregnancy with history of pre-term labor, unspecified trimester: Principal | ICD-10-CM

## 2015-11-17 DIAGNOSIS — O09899 Supervision of other high risk pregnancies, unspecified trimester: Secondary | ICD-10-CM

## 2015-11-17 MED ORDER — HYDROXYPROGESTERONE CAPROATE 250 MG/ML IM OIL
250.0000 mg | TOPICAL_OIL | Freq: Once | INTRAMUSCULAR | Status: AC
  Administered 2015-11-17: 250 mg via INTRAMUSCULAR

## 2015-11-17 NOTE — Progress Notes (Signed)
Pt requested fht to be checked, HR 144.

## 2015-11-19 ENCOUNTER — Observation Stay
Admission: EM | Admit: 2015-11-19 | Discharge: 2015-11-19 | Discharge status: Against Medical Advice | Payer: Medicaid Other | Attending: Obstetrics and Gynecology | Admitting: Obstetrics and Gynecology

## 2015-11-19 ENCOUNTER — Encounter: Payer: Self-pay | Admitting: *Deleted

## 2015-11-19 DIAGNOSIS — Z3A20 20 weeks gestation of pregnancy: Secondary | ICD-10-CM | POA: Diagnosis not present

## 2015-11-19 DIAGNOSIS — Z5321 Procedure and treatment not carried out due to patient leaving prior to being seen by health care provider: Secondary | ICD-10-CM | POA: Insufficient documentation

## 2015-11-19 DIAGNOSIS — R52 Pain, unspecified: Secondary | ICD-10-CM | POA: Insufficient documentation

## 2015-11-19 DIAGNOSIS — O09219 Supervision of pregnancy with history of pre-term labor, unspecified trimester: Secondary | ICD-10-CM

## 2015-11-19 DIAGNOSIS — O09899 Supervision of other high risk pregnancies, unspecified trimester: Secondary | ICD-10-CM

## 2015-11-19 DIAGNOSIS — O26892 Other specified pregnancy related conditions, second trimester: Principal | ICD-10-CM | POA: Insufficient documentation

## 2015-11-19 DIAGNOSIS — R109 Unspecified abdominal pain: Secondary | ICD-10-CM | POA: Diagnosis present

## 2015-11-19 DIAGNOSIS — O0990 Supervision of high risk pregnancy, unspecified, unspecified trimester: Secondary | ICD-10-CM

## 2015-11-19 DIAGNOSIS — O34219 Maternal care for unspecified type scar from previous cesarean delivery: Secondary | ICD-10-CM

## 2015-11-19 LAB — URINALYSIS COMPLETE WITH MICROSCOPIC (ARMC ONLY)
Bilirubin Urine: NEGATIVE
Glucose, UA: NEGATIVE mg/dL
HGB URINE DIPSTICK: NEGATIVE
KETONES UR: NEGATIVE mg/dL
Leukocytes, UA: NEGATIVE
NITRITE: NEGATIVE
PH: 6 (ref 5.0–8.0)
PROTEIN: NEGATIVE mg/dL
SPECIFIC GRAVITY, URINE: 1.017 (ref 1.005–1.030)

## 2015-11-19 MED ORDER — ACETAMINOPHEN 325 MG PO TABS: 650.0000 mg | ORAL_TABLET | ORAL | Status: DC | PRN

## 2015-11-19 NOTE — Progress Notes (Signed)
Entered room to find patient up and dressing.  States that she must leave because of transportation issues.  Suggested signing out - stated that she had to leave now.  Will call for UA results later today.  Suggested SVE - stated that her cervix was checked early in the week.

## 2015-11-19 NOTE — OB Triage Provider Note (Signed)
TRIAGE VISIT with NST   Sarah Contreras is a 30 y.o. G2P0101. She is at 4228w1d gestation.  Indication: left sided pain, no fever, no dysuria  S: Resting comfortably. no CTX, no VB. No vaginal discharge. O:  BP 124/71 (BP Location: Left Arm)   Pulse 99   Temp 98.3 F (36.8 C) (Oral)   Resp 18   Ht 5\' 1"  (1.549 m)   Wt 187 lb (84.8 kg)   LMP 07/01/2015 (Exact Date)   BMI 35.33 kg/m  No results found for this or any previous visit (from the past 48 hour(s)).   Gen: NAD, AAOx3      Abd: FNTTP      Ext: Non-tender, Nonedmeatous, soft   FHT: 155 TOCO: quiet SVE:  not checked - patient declined   A/P:  30 y.o. [id number] 4628w1d with left sided pain.   Fetal heart tones confirmed  No contractions on monitor Urinalysis neg for infection markers, blood and sugar  Patient signed out AMA prior to labs returning. No acute issues noted during visit.

## 2015-11-19 NOTE — OB Triage Note (Signed)
Recvd to OBS3 with c/o left sided abdominal pain for two days which became worse at work this AM.  Changed to gown and to bed.  EFM applied.  Oriented to room and plan of care discussed.  Verbalized understanding and agrees with plan.

## 2015-11-23 ENCOUNTER — Inpatient Hospital Stay
Admission: EM | Admit: 2015-11-23 | Discharge: 2015-11-23 | Disposition: A | Discharge status: Home / Self Care | Payer: Medicaid Other | Attending: Obstetrics and Gynecology | Admitting: Obstetrics and Gynecology

## 2015-11-23 DIAGNOSIS — O26892 Other specified pregnancy related conditions, second trimester: Secondary | ICD-10-CM | POA: Insufficient documentation

## 2015-11-23 DIAGNOSIS — Z3A20 20 weeks gestation of pregnancy: Secondary | ICD-10-CM | POA: Diagnosis not present

## 2015-11-23 DIAGNOSIS — R102 Pelvic and perineal pain: Secondary | ICD-10-CM | POA: Insufficient documentation

## 2015-11-23 NOTE — Progress Notes (Signed)
Patient ID: Sarah Contreras, female   DOB: 1985/12/07, 30 y.o.   MRN: [id number] Sarah Contreras 1985/12/07 G2 P1 3658w5d presents for vaginal pain . Thinks she may have a bartholin cyst  noLOF , no vaginal bleeding ,Seen in [facility]  O;BP 115/69 (BP Location: Left Arm)   Pulse 90   Temp 98.4 F (36.9 C) (Oral)   Resp 18   Ht 5\' 1"  (1.549 m)   Wt 185 lb (83.9 kg)   LMP 07/01/2015 (Exact Date)   BMI 34.96 kg/m  ABDno h/o HSV  CX no checked  Vag : no evidence of enlarged bartholin  Glands, no ulceration   NSTnot done  Labs: none A: vaginal pain , nl exam   P[facility] and [facility] .

## 2015-11-23 NOTE — Discharge Summary (Signed)
  Progress Notes Date of Service: 11/23/2015 12:10 PM Suzy Bouchardhomas J Charlot Gouin, MD  Obstetrics    [] Hide copied text [] Hover for attribution information Patient ID: Sarah Contreras, female   DOB: 11/08/1985, 30 y.o.   MRN: [id number] Sarah Contreras 11/08/1985 G2 P1 2043w5d presents for vaginal pain . Thinks she may have a bartholin cyst  noLOF , no vaginal bleeding ,Seen in [facility]  O;BP 115/69 (BP Location: Left Arm)   Pulse 90   Temp 98.4 F (36.9 C) (Oral)   Resp 18   Ht 5\' 1"  (1.549 m)   Wt 185 lb (83.9 kg)   LMP 07/01/2015 (Exact Date)   BMI 34.96 kg/m  ABDno h/o HSV  CX no checked  Vag : no evidence of enlarged bartholin  Glands, no ulceration   NSTnot done  Labs: none A: vaginal pain , nl exam   P[facility] and not [facility] .      Electronically signed by Suzy Bouchardhomas J Marquasha Brutus, MD at 11/23/2015 12:13 PM      ED to Hosp-Admission (Current) on 11/23/2015        Detailed Report

## 2015-11-23 NOTE — OB Triage Note (Signed)
Patient is a G2P1 at 20.[redacted] weeks gestation who states that she has been having constant vaginal and pelvic pain since 11/22/15. States that she has history of Bartholin Cysts and this pain feels the same. Denies contractions, leaking of fluid, or vaginal bleeding.

## 2015-11-26 ENCOUNTER — Ambulatory Visit (HOSPITAL_COMMUNITY)
Admission: RE | Admit: 2015-11-26 | Discharge: 2015-11-26 | Disposition: A | Discharge status: Home / Self Care | Payer: Medicaid Other | Source: Ambulatory Visit | Attending: Obstetrics and Gynecology | Admitting: Obstetrics and Gynecology

## 2015-11-26 ENCOUNTER — Encounter (HOSPITAL_COMMUNITY): Payer: Self-pay

## 2015-11-26 ENCOUNTER — Other Ambulatory Visit (HOSPITAL_COMMUNITY): Payer: Self-pay | Admitting: Maternal and Fetal Medicine

## 2015-11-26 ENCOUNTER — Ambulatory Visit (INDEPENDENT_AMBULATORY_CARE_PROVIDER_SITE_OTHER): Payer: Medicaid Other | Admitting: *Deleted

## 2015-11-26 VITALS — BP 119/69 | HR 84 | Wt 191.2 lb

## 2015-11-26 DIAGNOSIS — O09219 Supervision of pregnancy with history of pre-term labor, unspecified trimester: Secondary | ICD-10-CM

## 2015-11-26 DIAGNOSIS — Z3A21 21 weeks gestation of pregnancy: Secondary | ICD-10-CM | POA: Insufficient documentation

## 2015-11-26 DIAGNOSIS — O24410 Gestational diabetes mellitus in pregnancy, diet controlled: Secondary | ICD-10-CM | POA: Insufficient documentation

## 2015-11-26 DIAGNOSIS — O0990 Supervision of high risk pregnancy, unspecified, unspecified trimester: Secondary | ICD-10-CM

## 2015-11-26 DIAGNOSIS — O99212 Obesity complicating pregnancy, second trimester: Secondary | ICD-10-CM | POA: Insufficient documentation

## 2015-11-26 DIAGNOSIS — O09899 Supervision of other high risk pregnancies, unspecified trimester: Secondary | ICD-10-CM

## 2015-11-26 DIAGNOSIS — O34219 Maternal care for unspecified type scar from previous cesarean delivery: Secondary | ICD-10-CM | POA: Diagnosis not present

## 2015-11-26 DIAGNOSIS — O09212 Supervision of pregnancy with history of pre-term labor, second trimester: Secondary | ICD-10-CM | POA: Insufficient documentation

## 2015-11-26 HISTORY — DX: Gestational diabetes mellitus in pregnancy, unspecified control: O24.419

## 2015-11-26 MED ORDER — HYDROXYPROGESTERONE CAPROATE 250 MG/ML IM OIL
250.0000 mg | TOPICAL_OIL | Freq: Once | INTRAMUSCULAR | Status: AC
  Administered 2015-11-26: 250 mg via INTRAMUSCULAR

## 2015-12-03 ENCOUNTER — Ambulatory Visit: Payer: Medicaid Other | Admitting: *Deleted

## 2015-12-03 DIAGNOSIS — O09219 Supervision of pregnancy with history of pre-term labor, unspecified trimester: Principal | ICD-10-CM

## 2015-12-03 DIAGNOSIS — O09899 Supervision of other high risk pregnancies, unspecified trimester: Secondary | ICD-10-CM

## 2015-12-03 MED ORDER — TERCONAZOLE 0.4 % VA CREA: 1.0000 | TOPICAL_CREAM | Freq: Every day | VAGINAL | 0 refills | Status: AC

## 2015-12-03 MED ORDER — HYDROXYPROGESTERONE CAPROATE 250 MG/ML IM OIL
250.0000 mg | TOPICAL_OIL | Freq: Once | INTRAMUSCULAR | Status: AC
  Administered 2015-12-03: 250 mg via INTRAMUSCULAR

## 2015-12-03 NOTE — Progress Notes (Signed)
Patient presents to clinic for 17-p injection. Tolerated well. She c/o discharge and stated she was extemely itchy in the vaginal area. Terconazol cream per ob protocol, sent to pharmacy.

## 2015-12-07 ENCOUNTER — Telehealth: Payer: Self-pay | Admitting: *Deleted

## 2015-12-07 NOTE — Telephone Encounter (Signed)
Received a message from 12/04/15 stating will ship next makena on 12/09/15 unless this is not ok. Called and notified them we have 2 doses, do not ship until 12/15/15.

## 2015-12-10 ENCOUNTER — Encounter (HOSPITAL_COMMUNITY): Payer: Self-pay

## 2015-12-10 ENCOUNTER — Other Ambulatory Visit (HOSPITAL_COMMUNITY): Payer: Self-pay | Admitting: Maternal and Fetal Medicine

## 2015-12-10 ENCOUNTER — Ambulatory Visit (HOSPITAL_COMMUNITY)
Admission: RE | Admit: 2015-12-10 | Discharge: 2015-12-10 | Disposition: A | Discharge status: Home / Self Care | Payer: Medicaid Other | Source: Ambulatory Visit | Attending: Obstetrics and Gynecology | Admitting: Obstetrics and Gynecology

## 2015-12-10 ENCOUNTER — Ambulatory Visit (INDEPENDENT_AMBULATORY_CARE_PROVIDER_SITE_OTHER): Payer: Medicaid Other | Admitting: Clinical

## 2015-12-10 ENCOUNTER — Other Ambulatory Visit (HOSPITAL_COMMUNITY): Payer: Self-pay | Admitting: *Deleted

## 2015-12-10 ENCOUNTER — Ambulatory Visit (INDEPENDENT_AMBULATORY_CARE_PROVIDER_SITE_OTHER): Payer: Medicaid Other | Admitting: Obstetrics and Gynecology

## 2015-12-10 VITALS — BP 119/71 | HR 87 | Wt 192.1 lb

## 2015-12-10 DIAGNOSIS — O09219 Supervision of pregnancy with history of pre-term labor, unspecified trimester: Secondary | ICD-10-CM

## 2015-12-10 DIAGNOSIS — Z362 Encounter for other antenatal screening follow-up: Secondary | ICD-10-CM

## 2015-12-10 DIAGNOSIS — O0990 Supervision of high risk pregnancy, unspecified, unspecified trimester: Secondary | ICD-10-CM

## 2015-12-10 DIAGNOSIS — F419 Anxiety disorder, unspecified: Secondary | ICD-10-CM

## 2015-12-10 DIAGNOSIS — O09899 Supervision of other high risk pregnancies, unspecified trimester: Secondary | ICD-10-CM

## 2015-12-10 DIAGNOSIS — O99212 Obesity complicating pregnancy, second trimester: Secondary | ICD-10-CM | POA: Diagnosis not present

## 2015-12-10 DIAGNOSIS — F331 Major depressive disorder, recurrent, moderate: Secondary | ICD-10-CM

## 2015-12-10 DIAGNOSIS — O34219 Maternal care for unspecified type scar from previous cesarean delivery: Secondary | ICD-10-CM

## 2015-12-10 DIAGNOSIS — O24410 Gestational diabetes mellitus in pregnancy, diet controlled: Secondary | ICD-10-CM

## 2015-12-10 DIAGNOSIS — O24419 Gestational diabetes mellitus in pregnancy, unspecified control: Secondary | ICD-10-CM | POA: Insufficient documentation

## 2015-12-10 DIAGNOSIS — Z3A23 23 weeks gestation of pregnancy: Secondary | ICD-10-CM

## 2015-12-10 DIAGNOSIS — O34212 Maternal care for vertical scar from previous cesarean delivery: Secondary | ICD-10-CM | POA: Insufficient documentation

## 2015-12-10 DIAGNOSIS — F32A Depression, unspecified: Secondary | ICD-10-CM

## 2015-12-10 DIAGNOSIS — O09212 Supervision of pregnancy with history of pre-term labor, second trimester: Secondary | ICD-10-CM | POA: Insufficient documentation

## 2015-12-10 DIAGNOSIS — F329 Major depressive disorder, single episode, unspecified: Secondary | ICD-10-CM

## 2015-12-10 DIAGNOSIS — O99340 Other mental disorders complicating pregnancy, unspecified trimester: Secondary | ICD-10-CM

## 2015-12-10 DIAGNOSIS — O99342 Other mental disorders complicating pregnancy, second trimester: Secondary | ICD-10-CM

## 2015-12-10 MED ORDER — HYDROXYPROGESTERONE CAPROATE 250 MG/ML IM OIL
250.0000 mg | TOPICAL_OIL | Freq: Once | INTRAMUSCULAR | Status: AC
  Administered 2015-12-10: 250 mg via INTRAMUSCULAR

## 2015-12-10 MED ORDER — SERTRALINE HCL 50 MG PO TABS: 100.0000 mg | ORAL_TABLET | Freq: Every day | ORAL | 1 refills | Status: DC

## 2015-12-10 NOTE — BH Specialist Note (Signed)
Session Start time: 4:15   End Time: 4:25 Total Time:  10 minutes Type of Service: Behavioral Health - Individual/Family Interpreter: No.   Interpreter Name & Language: n/a # [facility]June [facility]   SUBJECTIVE: Sarah Contreras is a 30 y.o. female  Pt. was f/u for:  depression. Pt. reports the following symptoms/concerns: Pt states that she does not feel like the Zoloft is helping as much as she would like, is staying active at work, continuing to have sleep problems, and wants to try increasing Zoloft. Duration of problem:  Increase in past two months Severity: moderate Previous treatment: Treated in past with antidepressants at higher dose   OBJECTIVE: Mood: Appropriate & Affect: Appropriate Risk of harm to self or others: No known risk of harm to self or others Assessments administered: none today  LIFE CONTEXT:  Family & Social: Lives with partner(who is also pregnant, expecting in May)  School/ Work: Works fulltime in [employer] Self-Care: Issues with sleep  Life changes: Current pregnancy/partner's current pregnancy as well What is important to pt/family (values): Family, Staying healthy   GOALS ADDRESSED:  -Alleviate symptoms of depression  INTERVENTIONS: Strength-based and Supportive   ASSESSMENT:  Pt currently experiencing Major depressive disorder, moderate, currently active.  Pt may benefit from brief therapeutic intervention today, and f/u visit to check on BH med symptoms.      PLAN: 1. F/U with behavioral health clinician: Two weeks 2. Behavioral Health meds: Zoloft, dose increase today 3. Behavioral recommendations:  -Continue staying physically active; also playing music at home to cope 4. Referral: Brief Counseling/Psychotherapy   Rae LipsJamie C Mcmannes LCSWA Behavioral Health Clinician  Warmhandoff: no  Dep[facility] screen [facility] 12/03/2015 11/12/2015  Decreased Interest 1 2  Down, Depressed, Hopeless - 2  PHQ - 2 Score 1 4  Altered  sleeping 2 2  Tired, decreased energy 3 2  Change in appetite 3 2  Feeling bad or failure about yourself  1 1  Trouble concentrating 1 1  Moving slowly or fidgety/restless 2 2  Suicidal thoughts 0 0  PHQ-9 Score 13 14   GAD 7 : Generalized Anxiety Score 12/03/2015 11/12/2015  Nervous, Anxious, on Edge 1 3  Control/stop worrying 1 1  Worry too much - different things 1 1  Trouble relaxing 2 3  Restless 1 2  Easily annoyed or irritable 1 1  Afraid - awful might happen 1 1  Total GAD 7 Score 8 12
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
## 2015-12-10 NOTE — Patient Instructions (Signed)
Gestational Diabetes Mellitus  Gestational diabetes mellitus, often simply referred to as gestational diabetes, is a type of diabetes that some women develop during pregnancy. In gestational diabetes, the pancreas does not make enough insulin (a hormone), the cells are less responsive to the insulin that is made (insulin resistance), or both. Normally, insulin moves sugars from food into the tissue cells. The tissue cells use the sugars for energy. The lack of insulin or the lack of normal response to insulin causes excess sugars to build up in the blood instead of going into the tissue cells. As a result, high blood sugar (hyperglycemia) develops. The effect of high sugar (glucose) levels can cause many problems.   RISK FACTORS  You have an increased chance of developing gestational diabetes if you have a family history of diabetes and also have one or more of the following risk factors:  · A body mass index over 30 (obesity).  · A previous pregnancy with gestational diabetes.  · An older age at the time of pregnancy.  If blood glucose levels are kept in the normal range during pregnancy, women can have a healthy pregnancy. If your blood glucose levels are not well controlled, there may be risks to you, your unborn baby (fetus), your labor and delivery, or your newborn baby.   SYMPTOMS   If symptoms are experienced, they are much like symptoms you would normally expect during pregnancy. The symptoms of gestational diabetes include:   · Increased thirst (polydipsia).  · Increased urination (polyuria).  · Increased urination during the night (nocturia).  · Weight loss. This weight loss may be rapid.  · Frequent, recurring infections.  · Tiredness (fatigue).  · Weakness.  · Vision changes, such as blurred vision.  · Fruity smell to your breath.  · Abdominal pain.  DIAGNOSIS  Diabetes is diagnosed when blood glucose levels are increased. Your blood glucose level may be checked by one or more of the following blood  tests:  · A fasting blood glucose test. You will not be allowed to eat for at least 8 hours before a blood sample is taken.  · A random blood glucose test. Your blood glucose is checked at any time of the day regardless of when you ate.  · An oral glucose tolerance test (OGTT). Your blood glucose is measured after you have not eaten (fasted) for 1-3 hours and then after you drink a glucose-containing beverage. Since the hormones that cause insulin resistance are highest at about 24-28 weeks of a pregnancy, an OGTT is usually performed during that time. If you have risk factors, you may be screened for undiagnosed type 2 diabetes at your first prenatal visit.  TREATMENT   Gestational diabetes should be managed first with diet and exercise. Medicines may be added only if they are needed.  · You will need to take diabetes medicine or insulin daily to keep blood glucose levels in the desired range.  · You will need to match insulin dosing with exercise and healthy food choices.  If you have gestational diabetes, your treatment goal is to maintain the following blood glucose levels:  · Before meals (preprandial): at or below 95 mg/dL.  · After meals (postprandial):    One hour after a meal: at or below 140 mg/dL.    Two hours after a meal: at or below 120 mg/dL.  If you have pre-existing type 1 or type 2 diabetes, your treatment goal is to maintain the following blood glucose levels:  · Before   meals, at bedtime, and overnight: 60-99 mg/dL.  · After meals: peak of 100-129 mg/dL.  HOME CARE INSTRUCTIONS   · Have your hemoglobin A1c level checked twice a year.  · Perform daily blood glucose monitoring as directed by your health care provider. It is common to perform frequent blood glucose monitoring.  · Monitor urine ketones when you are ill and as directed by your health care provider.  · Take your diabetes medicine and insulin as directed by your health care provider to maintain your blood glucose level in the desired  range.  ¨ Never run out of diabetes medicine or insulin. It is needed every day.  ¨ Adjust insulin based on your intake of carbohydrates. Carbohydrates can raise blood glucose levels but need to be included in your diet. Carbohydrates provide vitamins, minerals, and fiber which are an essential part of a healthy diet. Carbohydrates are found in fruits, vegetables, whole grains, dairy products, legumes, and foods containing added sugars.  · Eat healthy foods. Alternate 3 meals with 3 snacks.  · Maintain a healthy weight gain. The usual total expected weight gain varies according to your prepregnancy body mass index (BMI).  · Carry a medical alert card or wear your medical alert jewelry.  · Carry a 15-gram carbohydrate snack with you at all times to treat low blood glucose (hypoglycemia). Some examples of 15-gram carbohydrate snacks include:  ¨ Glucose tablets, 3 or 4.  ¨ Glucose gel, 15-gram tube.  ¨ Raisins, 2 tablespoons (24 g).  ¨ Jelly beans, 6.  ¨ Animal crackers, 8.  ¨ Fruit juice, regular soda, or low-fat milk, 4 ounces (120 mL).  ¨ Gummy treats, 9.  · Recognize hypoglycemia. Hypoglycemia during pregnancy occurs with blood glucose levels of 60 mg/dL and below. The risk for hypoglycemia increases when fasting or skipping meals, during or after intense exercise, and during sleep. Hypoglycemia symptoms can include:  ¨ Tremors or shakes.  ¨ Decreased ability to concentrate.  ¨ Sweating.  ¨ Increased heart rate.  ¨ Headache.  ¨ Dry mouth.  ¨ Hunger.  ¨ Irritability.  ¨ Anxiety.  ¨ Restless sleep.  ¨ Altered speech or coordination.  ¨ Confusion.  · Treat hypoglycemia promptly. If you are alert and able to safely swallow, follow the 15:15 rule:  ¨ Take 15-20 grams of rapid-acting glucose or carbohydrate. Rapid-acting options include glucose gel, glucose tablets, or 4 ounces (120 mL) of fruit juice, regular soda, or low-fat milk.  ¨ Check your blood glucose level 15 minutes after taking the glucose.  ¨ Take 15-20  grams more of glucose if the repeat blood glucose level is still 70 mg/dL or below.  ¨ Eat a meal or snack within 1 hour once blood glucose levels return to normal.  · Be alert to polyuria (excess urination) and polydipsia (excess thirst) which are early signs of hyperglycemia. An early awareness of hyperglycemia allows for prompt treatment. Treat hyperglycemia as directed by your health care provider.  · Engage in at least 30 minutes of physical activity a day or as directed by your health care provider. Ten minutes of physical activity timed 30 minutes after each meal is encouraged to control postprandial blood glucose levels.  · Adjust your insulin dosing and food intake as needed if you start a new exercise or sport.  · Follow your sick-day plan at any time you are unable to eat or drink as usual.  · Avoid tobacco and alcohol use.  · Keep all follow-up visits as directed   by your health care provider.  · Follow the advice of your health care provider regarding your prenatal and post-delivery (postpartum) appointments, meal planning, exercise, medicines, vitamins, blood tests, other medical tests, and physical activities.  · Perform daily skin and foot care. Examine your skin and feet daily for cuts, bruises, redness, nail problems, bleeding, blisters, or sores.  · Brush your teeth and gums at least twice a day and floss at least once a day. Follow up with your dentist regularly.  · Schedule an eye exam during the first trimester of your pregnancy or as directed by your health care provider.  · Share your diabetes management plan with your workplace or school.  · Stay up-to-date with immunizations.  · Learn to manage stress.  · Obtain ongoing diabetes education and support as needed.  · Learn about and consider breastfeeding your baby.  · You should have your blood sugar level checked 6-12 weeks after delivery. This is done with an oral glucose tolerance test (OGTT).  SEEK MEDICAL CARE IF:   · You are unable to  eat food or drink fluids for more than 6 hours.  · You have nausea and vomiting for more than 6 hours.  · You have a blood glucose level of 200 mg/dL and you have ketones in your urine.  · There is a change in mental status.  · You develop vision problems.  · You have a persistent headache.  · You have upper abdominal pain or discomfort.  · You develop an additional serious illness.  · You have diarrhea for more than 6 hours.  · You have been sick or have had a fever for a couple of days and are not getting better.  SEEK IMMEDIATE MEDICAL CARE IF:   · You have difficulty breathing.  · You no longer feel the baby moving.  · You are bleeding or have discharge from your vagina.  · You start having premature contractions or labor.  MAKE SURE YOU:  · Understand these instructions.  · Will watch your condition.  · Will get help right away if you are not doing well or get worse.     This information is not intended to replace advice given to you by your health care provider. Make sure you discuss any questions you have with your health care provider.     Document Released: 04/25/2000 Document Revised: 02/07/2014 Document Reviewed: 08/16/2011  Elsevier Interactive Patient Education ©2016 [employer]

## 2015-12-17 ENCOUNTER — Ambulatory Visit: Payer: Self-pay

## 2015-12-22 ENCOUNTER — Encounter (HOSPITAL_COMMUNITY): Payer: Self-pay

## 2015-12-22 ENCOUNTER — Ambulatory Visit (INDEPENDENT_AMBULATORY_CARE_PROVIDER_SITE_OTHER): Payer: Medicaid Other | Admitting: *Deleted

## 2015-12-22 ENCOUNTER — Ambulatory Visit (HOSPITAL_COMMUNITY)
Admission: RE | Admit: 2015-12-22 | Discharge: 2015-12-22 | Disposition: A | Discharge status: Home / Self Care | Payer: Medicaid Other | Source: Ambulatory Visit | Attending: Obstetrics and Gynecology | Admitting: Obstetrics and Gynecology

## 2015-12-22 VITALS — BP 121/66 | HR 98 | Wt 194.7 lb

## 2015-12-22 DIAGNOSIS — Z3A24 24 weeks gestation of pregnancy: Secondary | ICD-10-CM | POA: Insufficient documentation

## 2015-12-22 DIAGNOSIS — O09219 Supervision of pregnancy with history of pre-term labor, unspecified trimester: Principal | ICD-10-CM

## 2015-12-22 DIAGNOSIS — O09212 Supervision of pregnancy with history of pre-term labor, second trimester: Secondary | ICD-10-CM

## 2015-12-22 DIAGNOSIS — O0990 Supervision of high risk pregnancy, unspecified, unspecified trimester: Secondary | ICD-10-CM

## 2015-12-22 DIAGNOSIS — O34219 Maternal care for unspecified type scar from previous cesarean delivery: Secondary | ICD-10-CM | POA: Diagnosis not present

## 2015-12-22 DIAGNOSIS — O24410 Gestational diabetes mellitus in pregnancy, diet controlled: Secondary | ICD-10-CM | POA: Diagnosis not present

## 2015-12-22 DIAGNOSIS — O09899 Supervision of other high risk pregnancies, unspecified trimester: Secondary | ICD-10-CM

## 2015-12-22 DIAGNOSIS — O99212 Obesity complicating pregnancy, second trimester: Secondary | ICD-10-CM | POA: Diagnosis not present

## 2015-12-22 MED ORDER — HYDROXYPROGESTERONE CAPROATE 250 MG/ML IM OIL
250.0000 mg | TOPICAL_OIL | Freq: Once | INTRAMUSCULAR | Status: AC
  Administered 2015-12-22: 250 mg via INTRAMUSCULAR

## 2015-12-23 ENCOUNTER — Other Ambulatory Visit (HOSPITAL_COMMUNITY): Payer: Self-pay | Admitting: *Deleted

## 2015-12-23 ENCOUNTER — Ambulatory Visit: Payer: Self-pay

## 2015-12-23 DIAGNOSIS — O09219 Supervision of pregnancy with history of pre-term labor, unspecified trimester: Secondary | ICD-10-CM

## 2015-12-23 DIAGNOSIS — O24410 Gestational diabetes mellitus in pregnancy, diet controlled: Secondary | ICD-10-CM

## 2015-12-31 ENCOUNTER — Ambulatory Visit: Payer: Medicaid Other

## 2016-01-01 ENCOUNTER — Ambulatory Visit (INDEPENDENT_AMBULATORY_CARE_PROVIDER_SITE_OTHER): Payer: Medicaid Other | Admitting: *Deleted

## 2016-01-01 VITALS — BP 124/79 | HR 97

## 2016-01-01 DIAGNOSIS — O09212 Supervision of pregnancy with history of pre-term labor, second trimester: Secondary | ICD-10-CM

## 2016-01-01 DIAGNOSIS — O0990 Supervision of high risk pregnancy, unspecified, unspecified trimester: Secondary | ICD-10-CM

## 2016-01-01 DIAGNOSIS — N898 Other specified noninflammatory disorders of vagina: Secondary | ICD-10-CM

## 2016-01-01 DIAGNOSIS — R300 Dysuria: Secondary | ICD-10-CM

## 2016-01-01 DIAGNOSIS — O09219 Supervision of pregnancy with history of pre-term labor, unspecified trimester: Secondary | ICD-10-CM

## 2016-01-01 DIAGNOSIS — O09899 Supervision of other high risk pregnancies, unspecified trimester: Secondary | ICD-10-CM

## 2016-01-01 LAB — POCT URINALYSIS DIP (DEVICE)
BILIRUBIN URINE: NEGATIVE
GLUCOSE, UA: NEGATIVE mg/dL
Hgb urine dipstick: NEGATIVE
KETONES UR: NEGATIVE mg/dL
LEUKOCYTES UA: NEGATIVE
NITRITE: NEGATIVE
PH: 7 (ref 5.0–8.0)
Protein, ur: NEGATIVE mg/dL
Specific Gravity, Urine: 1.015 (ref 1.005–1.030)
Urobilinogen, UA: 0.2 mg/dL (ref 0.0–1.0)

## 2016-01-01 MED ORDER — HYDROXYPROGESTERONE CAPROATE 250 MG/ML IM OIL
250.0000 mg | TOPICAL_OIL | Freq: Once | INTRAMUSCULAR | Status: AC
  Administered 2016-01-01: 250 mg via INTRAMUSCULAR

## 2016-01-01 NOTE — Progress Notes (Signed)
Here for 17 p  And also thinks she has a uti because she has burning before she urinates and after, but not while urinating. Urinalysis was clear. States has taken the terazole cream that was prescribed and it helped the itching, but still having burning. Stated has white , thick vaginal discharge somedays. Will recheck wet prep- patient to collect swab.

## 2016-01-02 ENCOUNTER — Other Ambulatory Visit: Payer: Self-pay | Admitting: Obstetrics & Gynecology

## 2016-01-02 LAB — WET PREP, GENITAL
TRICH WET PREP: NONE SEEN
WBC WET PREP: NONE SEEN

## 2016-01-02 MED ORDER — FLUCONAZOLE 150 MG PO TABS: 150.0000 mg | ORAL_TABLET | Freq: Once | ORAL | 0 refills | Status: AC

## 2016-01-02 MED ORDER — METRONIDAZOLE 500 MG PO TABS: 500.0000 mg | ORAL_TABLET | Freq: Two times a day (BID) | ORAL | 0 refills | Status: DC

## 2016-01-02 NOTE — Progress Notes (Signed)
Yeast and BV on wet prep.  Flagyl and diflucan prescribed.  RN to call patient.

## 2016-01-07 ENCOUNTER — Encounter (HOSPITAL_COMMUNITY): Payer: Self-pay

## 2016-01-07 ENCOUNTER — Ambulatory Visit (HOSPITAL_COMMUNITY)
Admission: RE | Admit: 2016-01-07 | Discharge: 2016-01-07 | Disposition: A | Discharge status: Home / Self Care | Payer: Medicaid Other | Source: Ambulatory Visit | Attending: Obstetrics and Gynecology | Admitting: Obstetrics and Gynecology

## 2016-01-07 ENCOUNTER — Other Ambulatory Visit (HOSPITAL_COMMUNITY): Payer: Self-pay | Admitting: *Deleted

## 2016-01-07 ENCOUNTER — Ambulatory Visit (INDEPENDENT_AMBULATORY_CARE_PROVIDER_SITE_OTHER): Payer: Medicaid Other | Admitting: Family

## 2016-01-07 ENCOUNTER — Other Ambulatory Visit (HOSPITAL_COMMUNITY): Payer: Self-pay | Admitting: Obstetrics and Gynecology

## 2016-01-07 VITALS — BP 118/78 | HR 108 | Wt 198.9 lb

## 2016-01-07 DIAGNOSIS — F32A Depression, unspecified: Secondary | ICD-10-CM

## 2016-01-07 DIAGNOSIS — O34211 Maternal care for low transverse scar from previous cesarean delivery: Secondary | ICD-10-CM | POA: Insufficient documentation

## 2016-01-07 DIAGNOSIS — O24419 Gestational diabetes mellitus in pregnancy, unspecified control: Secondary | ICD-10-CM | POA: Insufficient documentation

## 2016-01-07 DIAGNOSIS — O24410 Gestational diabetes mellitus in pregnancy, diet controlled: Secondary | ICD-10-CM

## 2016-01-07 DIAGNOSIS — O09212 Supervision of pregnancy with history of pre-term labor, second trimester: Secondary | ICD-10-CM | POA: Insufficient documentation

## 2016-01-07 DIAGNOSIS — Z3A27 27 weeks gestation of pregnancy: Secondary | ICD-10-CM

## 2016-01-07 DIAGNOSIS — O99340 Other mental disorders complicating pregnancy, unspecified trimester: Principal | ICD-10-CM

## 2016-01-07 DIAGNOSIS — O34219 Maternal care for unspecified type scar from previous cesarean delivery: Secondary | ICD-10-CM

## 2016-01-07 DIAGNOSIS — O09219 Supervision of pregnancy with history of pre-term labor, unspecified trimester: Secondary | ICD-10-CM

## 2016-01-07 DIAGNOSIS — O09899 Supervision of other high risk pregnancies, unspecified trimester: Secondary | ICD-10-CM

## 2016-01-07 DIAGNOSIS — O99342 Other mental disorders complicating pregnancy, second trimester: Secondary | ICD-10-CM

## 2016-01-07 DIAGNOSIS — F329 Major depressive disorder, single episode, unspecified: Secondary | ICD-10-CM | POA: Diagnosis not present

## 2016-01-07 DIAGNOSIS — O0990 Supervision of high risk pregnancy, unspecified, unspecified trimester: Secondary | ICD-10-CM

## 2016-01-07 LAB — POCT URINALYSIS DIP (DEVICE)
Bilirubin Urine: NEGATIVE
Glucose, UA: NEGATIVE mg/dL
HGB URINE DIPSTICK: NEGATIVE
Ketones, ur: NEGATIVE mg/dL
LEUKOCYTES UA: NEGATIVE
Nitrite: NEGATIVE
PH: 6.5 (ref 5.0–8.0)
Protein, ur: NEGATIVE mg/dL
SPECIFIC GRAVITY, URINE: 1.02 (ref 1.005–1.030)
UROBILINOGEN UA: 0.2 mg/dL (ref 0.0–1.0)

## 2016-01-07 LAB — CBC
HEMATOCRIT: 35.3 % (ref 35.0–45.0)
HEMOGLOBIN: 11.6 g/dL — AB (ref 11.7–15.5)
MCH: 28 pg (ref 27.0–33.0)
MCHC: 32.9 g/dL (ref 32.0–36.0)
MCV: 85.1 fL (ref 80.0–100.0)
MPV: 9.5 fL (ref 7.5–12.5)
Platelets: 247 10*3/uL (ref 140–400)
RBC: 4.15 MIL/uL (ref 3.80–5.10)
RDW: 14.1 % (ref 11.0–15.0)
WBC: 13.9 10*3/uL — AB (ref 3.8–10.8)

## 2016-01-07 LAB — GLUCOSE, CAPILLARY: Glucose-Capillary: 99 mg/dL (ref 65–99)

## 2016-01-07 MED ORDER — HYDROXYPROGESTERONE CAPROATE 250 MG/ML IM OIL
250.0000 mg | TOPICAL_OIL | Freq: Once | INTRAMUSCULAR | Status: AC
  Administered 2016-01-07: 250 mg via INTRAMUSCULAR

## 2016-01-07 MED ORDER — ACCU-CHEK SOFTCLIX LANCETS MISC: 12 refills | Status: DC

## 2016-01-07 MED ORDER — GLUCOSE BLOOD VI STRP: ORAL_STRIP | 12 refills | Status: DC

## 2016-01-07 MED ORDER — ALBUTEROL SULFATE HFA 108 (90 BASE) MCG/ACT IN AERS: 2.0000 | INHALATION_SPRAY | RESPIRATORY_TRACT | 2 refills | Status: AC | PRN
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
## 2016-01-07 NOTE — Addendum Note (Signed)
Addended by: Garret ReddishBARNES, Kimberley Dastrup M on: 01/07/2016 03:41 PM   Modules accepted: Orders
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
## 2016-01-08 ENCOUNTER — Telehealth: Payer: Self-pay | Admitting: *Deleted

## 2016-01-08 LAB — HIV ANTIBODY (ROUTINE TESTING W REFLEX): HIV 1&2 Ab, 4th Generation: NONREACTIVE

## 2016-01-08 LAB — RPR

## 2016-01-08 NOTE — Telephone Encounter (Signed)
[facility] message this week wanting to schedule delivery of makena. We have 2 doses on hand, I called [facility] back and scheduled delivery for 01/19/16.

## 2016-01-12 ENCOUNTER — Encounter: Payer: Self-pay | Admitting: General Practice

## 2016-01-13 ENCOUNTER — Encounter: Payer: Self-pay | Admitting: General Practice

## 2016-01-13 ENCOUNTER — Encounter: Payer: Self-pay | Admitting: Obstetrics and Gynecology

## 2016-01-13 NOTE — Progress Notes (Signed)
Patient did not keep return OB appointment for 01/13/2016.  Sarah Contreras, Jr MD Attending Center [facility](Faculty Practice)

## 2016-01-28 DIAGNOSIS — O0990 Supervision of high risk pregnancy, unspecified, unspecified trimester: Secondary | ICD-10-CM

## 2016-01-28 DIAGNOSIS — O34219 Maternal care for unspecified type scar from previous cesarean delivery: Secondary | ICD-10-CM

## 2016-01-28 DIAGNOSIS — O09219 Supervision of pregnancy with history of pre-term labor, unspecified trimester: Secondary | ICD-10-CM

## 2016-01-28 DIAGNOSIS — O09899 Supervision of other high risk pregnancies, unspecified trimester: Secondary | ICD-10-CM

## 2016-02-04 ENCOUNTER — Other Ambulatory Visit (HOSPITAL_COMMUNITY): Payer: Self-pay | Admitting: Obstetrics and Gynecology

## 2016-02-04 ENCOUNTER — Ambulatory Visit (INDEPENDENT_AMBULATORY_CARE_PROVIDER_SITE_OTHER): Payer: Medicaid Other | Admitting: Clinical

## 2016-02-04 ENCOUNTER — Ambulatory Visit (HOSPITAL_COMMUNITY)
Admission: RE | Admit: 2016-02-04 | Discharge: 2016-02-04 | Disposition: A | Discharge status: Home / Self Care | Payer: Medicaid Other | Source: Ambulatory Visit | Attending: Obstetrics and Gynecology | Admitting: Obstetrics and Gynecology

## 2016-02-04 ENCOUNTER — Ambulatory Visit (INDEPENDENT_AMBULATORY_CARE_PROVIDER_SITE_OTHER): Payer: Medicaid Other | Admitting: Family Medicine

## 2016-02-04 ENCOUNTER — Ambulatory Visit: Payer: Self-pay

## 2016-02-04 ENCOUNTER — Encounter (HOSPITAL_COMMUNITY): Payer: Self-pay

## 2016-02-04 VITALS — BP 107/75 | HR 113 | Wt 198.8 lb

## 2016-02-04 DIAGNOSIS — O24410 Gestational diabetes mellitus in pregnancy, diet controlled: Secondary | ICD-10-CM

## 2016-02-04 DIAGNOSIS — F332 Major depressive disorder, recurrent severe without psychotic features: Secondary | ICD-10-CM | POA: Diagnosis not present

## 2016-02-04 DIAGNOSIS — O09893 Supervision of other high risk pregnancies, third trimester: Secondary | ICD-10-CM

## 2016-02-04 DIAGNOSIS — O34219 Maternal care for unspecified type scar from previous cesarean delivery: Secondary | ICD-10-CM

## 2016-02-04 DIAGNOSIS — Z3A31 31 weeks gestation of pregnancy: Secondary | ICD-10-CM | POA: Diagnosis not present

## 2016-02-04 DIAGNOSIS — O09213 Supervision of pregnancy with history of pre-term labor, third trimester: Secondary | ICD-10-CM | POA: Diagnosis not present

## 2016-02-04 DIAGNOSIS — O0990 Supervision of high risk pregnancy, unspecified, unspecified trimester: Secondary | ICD-10-CM

## 2016-02-04 DIAGNOSIS — O09899 Supervision of other high risk pregnancies, unspecified trimester: Secondary | ICD-10-CM

## 2016-02-04 DIAGNOSIS — O09219 Supervision of pregnancy with history of pre-term labor, unspecified trimester: Secondary | ICD-10-CM

## 2016-02-04 DIAGNOSIS — O34211 Maternal care for low transverse scar from previous cesarean delivery: Secondary | ICD-10-CM | POA: Insufficient documentation

## 2016-02-04 DIAGNOSIS — O24419 Gestational diabetes mellitus in pregnancy, unspecified control: Secondary | ICD-10-CM | POA: Insufficient documentation

## 2016-02-04 DIAGNOSIS — F419 Anxiety disorder, unspecified: Secondary | ICD-10-CM

## 2016-02-04 DIAGNOSIS — K219 Gastro-esophageal reflux disease without esophagitis: Secondary | ICD-10-CM

## 2016-02-04 LAB — HEMOGLOBIN A1C
Hgb A1c MFr Bld: 4.9 % (ref <5.7)
Mean Plasma Glucose: 94 mg/dL

## 2016-02-04 MED ORDER — HYDROXYPROGESTERONE CAPROATE 250 MG/ML IM OIL
250.0000 mg | TOPICAL_OIL | Freq: Once | INTRAMUSCULAR | Status: AC
  Administered 2016-02-04: 250 mg via INTRAMUSCULAR

## 2016-02-04 MED ORDER — OMEPRAZOLE MAGNESIUM 20 MG PO TBEC: 20.0000 mg | DELAYED_RELEASE_TABLET | Freq: Every day | ORAL | 3 refills | Status: DC

## 2016-02-04 NOTE — BH Specialist Note (Signed)
Session Start time: 3:45   End Time: 4:15 Total Time:  30 minutes Type of Service: Behavioral Health - Individual/Family Interpreter: No.   Interpreter Name & Language: n/a # [facility] July [facility]-June 2018: 2nd  SUBJECTIVE: Sarah Contreras is a 31 y.o. female  Pt. was referred by Dr Shawnie PonsPratt for:  anxiety and depression. Pt. 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  Pt may benefit from psychoeducation and brief thereapeutic intervention regarding coping with symptoms of depression and anxiety.   PLAN: 1. F/U with behavioral health clinician: Two weeks 2. Behavioral Health meds: Zoloft, 100mg  3. Behavioral recommendations:  -Practice CALM relaxation breathing exercise daily, in morning, and at bedtime -Take home [place] Postpartum Planner, to read over and plan for postpartum -Read educational material regarding coping with depression, anxiety and panic attacks -Consider Worry Hour strategy to begin to prioritize daily life  stressors -Continue using music during the daytime, and sleep sounds at night -Try Stop, Breathe, Think app at least once in next 24 hours -[facility] to establish care with psychiatry prior to childbirth -Begin new job on Monday 02-08-15 4. Referral: Brief Counseling/Psychotherapy, Publishing rights managerCommunity Resource and Psychoeducation 5. From scale of 1-10, how likely are you to follow plan: 8  Rae LipsJamie C Njeri Vicente LCSWA Behavioral Health Clinician  Marlon PelWarmhandoff: no  Depression screen [facility] 2/[facility] 02/04/2016 12/03/2015 11/12/2015  Decreased Interest 2 1 2   Down, Depressed, Hopeless 2 - 2  PHQ - 2 Score 4 1 4   Altered sleeping 3 2 2   Tired, decreased energy 2 3 2   Change in appetite 2 3 2   Feeling bad or failure about yourself  2 1 1   Trouble concentrating 1 1 1   Moving slowly or fidgety/restless 1 2 2   Suicidal thoughts 0 0 0  PHQ-9 Score 15 13 14    GAD 7 : Generalized Anxiety Score 02/04/2016 12/03/2015 11/12/2015  Nervous, Anxious, on Edge 3 1 3   Control/stop worrying 2 1 1   Worry too much - different things 2 1 1   Trouble relaxing 3 2 3   Restless 2 1 2   Easily annoyed or irritable 1 1 1   Afraid - awful might happen 1 1 1   Total GAD 7 Score 14 8 12
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
## 2016-02-04 NOTE — Patient Instructions (Signed)
Third Trimester of Pregnancy The third trimester is from week 29 through week 40 (months 7 through 9). The third trimester is a time when the unborn baby (fetus) is growing rapidly. At the end of the ninth month, the fetus is about 20 inches in length and weighs 6-10 pounds. Body changes during your third trimester Your body goes through many changes during pregnancy. The changes vary from woman to woman. During the third trimester:  Your weight will continue to increase. You can expect to gain 25-35 pounds (11-16 kg) by the end of the pregnancy.  You may begin to get stretch marks on your hips, abdomen, and breasts.  You may urinate more often because the fetus is moving lower into your pelvis and pressing on your bladder.  You may develop or continue to have heartburn. This is caused by increased hormones that slow down muscles in the digestive tract.  You may develop or continue to have constipation because increased hormones slow digestion and cause the muscles that push waste through your intestines to relax.  You may develop hemorrhoids. These are swollen veins (varicose veins) in the rectum that can itch or be painful.  You may develop swollen, bulging veins (varicose veins) in your legs.  You may have increased body aches in the pelvis, back, or thighs. This is due to weight gain and increased hormones that are relaxing your joints.  You may have changes in your hair. These can include thickening of your hair, rapid growth, and changes in texture. Some women also have hair loss during or after pregnancy, or hair that feels dry or thin. Your hair will most likely return to normal after your baby is born.  Your breasts will continue to grow and they will continue to become tender. A yellow fluid (colostrum) may leak from your breasts. This is the first milk you are producing for your baby.  Your belly button may stick out.  You may notice more swelling in your hands, face, or  ankles.  You may have increased tingling or numbness in your hands, arms, and legs. The skin on your belly may also feel numb.  You may feel short of breath because of your expanding uterus.  You may have more problems sleeping. This can be caused by the size of your belly, increased need to urinate, and an increase in your body's metabolism.  You may notice the fetus "dropping," or moving lower in your abdomen.  You may have increased vaginal discharge.  Your cervix becomes thin and soft (effaced) near your due date. What to expect at prenatal visits You will have prenatal exams every 2 weeks until week 36. Then you will have weekly prenatal exams. During a routine prenatal visit:  You will be weighed to make sure you and the fetus are growing normally.  Your blood pressure will be taken.  Your abdomen will be measured to track your baby's growth.  The fetal heartbeat will be listened to.  Any test results from the previous visit will be discussed.  You may have a cervical check near your due date to see if you have effaced. At around 36 weeks, your health care provider will check your cervix. At the same time, your health care provider will also perform a test on the secretions of the vaginal tissue. This test is to determine if a type of bacteria, Group B streptococcus, is present. Your health care provider will explain this further. Your health care provider may ask you:    What your birth plan is.  How you are feeling.  If you are feeling the baby move.  If you have had any abnormal symptoms, such as leaking fluid, bleeding, severe headaches, or abdominal cramping.  If you are using any tobacco products, including cigarettes, chewing tobacco, and electronic cigarettes.  If you have any questions. Other tests or screenings that may be performed during your third trimester include:  Blood tests that check for low iron levels (anemia).  Fetal testing to check the health,  activity level, and growth of the fetus. Testing is done if you have certain medical conditions or if there are problems during the pregnancy.  Nonstress test (NST). This test checks the health of your baby to make sure there are no signs of problems, such as the baby not getting enough oxygen. During this test, a belt is placed around your belly. The baby is made to move, and its heart rate is monitored during movement. What is false labor? False labor is a condition in which you feel small, irregular tightenings of the muscles in the womb (contractions) that eventually go away. These are called Braxton Hicks contractions. Contractions may last for hours, days, or even weeks before true labor sets in. If contractions come at regular intervals, become more frequent, increase in intensity, or become painful, you should see your health care provider. What are the signs of labor?  Abdominal cramps.  Regular contractions that start at 10 minutes apart and become stronger and more frequent with time.  Contractions that start on the top of the uterus and spread down to the lower abdomen and back.  Increased pelvic pressure and dull back pain.  A watery or bloody mucus discharge that comes from the vagina.  Leaking of amniotic fluid. This is also known as your "water breaking." It could be a slow trickle or a gush. Let your doctor know if it has a color or strange odor. If you have any of these signs, call your health care provider right away, even if it is before your due date. Follow these instructions at home: Eating and drinking  Continue to eat regular, healthy meals.  Do not eat:  Raw meat or meat spreads.  Unpasteurized milk or cheese.  Unpasteurized juice.  Store-made salad.  Refrigerated smoked seafood.  Hot dogs or deli meat, unless they are piping hot.  More than 6 ounces of albacore tuna a week.  Shark, swordfish, king mackerel, or tile fish.  Store-made salads.  Raw  sprouts, such as mung bean or alfalfa sprouts.  Take prenatal vitamins as told by your health care provider.  Take 1000 mg of calcium daily as told by your health care provider.  If you develop constipation:  Take over-the-counter or prescription medicines.  Drink enough fluid to keep your urine clear or pale yellow.  Eat foods that are high in fiber, such as fresh fruits and vegetables, whole grains, and beans.  Limit foods that are high in fat and processed sugars, such as fried and sweet foods. Activity  Exercise only as directed by your health care provider. Healthy pregnant women should aim for 2 hours and 30 minutes of moderate exercise per week. If you experience any pain or discomfort while exercising, stop.  Avoid heavy lifting.  Do not exercise in extreme heat or humidity, or at high altitudes.  Wear low-heel, comfortable shoes.  Practice good posture.  Do not travel far distances unless it is absolutely necessary and only with the approval   of your health care provider.  Wear your seat belt at all times while in a car, on a bus, or on a plane.  Take frequent breaks and rest with your legs elevated if you have leg cramps or low back pain.  Do not use hot tubs, steam rooms, or saunas.  You may continue to have sex unless your health care provider tells you otherwise. Lifestyle  Do not use any products that contain nicotine or tobacco, such as cigarettes and e-cigarettes. If you need help quitting, ask your health care provider.  Do not drink alcohol.  Do not use any medicinal herbs or unprescribed drugs. These chemicals affect the formation and growth of the baby.  If you develop varicose veins:  Wear support pantyhose or compression stockings as told by your healthcare provider.  Elevate your feet for 15 minutes, 3-4 times a day.  Wear a supportive maternity bra to help with breast tenderness. General instructions  Take over-the-counter and prescription  medicines only as told by your health care provider. There are medicines that are either safe or unsafe to take during pregnancy.  Take warm sitz baths to soothe any pain or discomfort caused by hemorrhoids. Use hemorrhoid cream or witch hazel if your health care provider approves.  Avoid cat litter boxes and soil used by cats. These carry germs that can cause birth defects in the baby. If you have a cat, ask someone to clean the litter box for you.  To prepare for the arrival of your baby:  Take prenatal classes to understand, practice, and ask questions about the labor and delivery.  Make a trial run to the hospital.  Visit the hospital and tour the maternity area.  Arrange for maternity or paternity leave through employers.  Arrange for family and friends to take care of pets while you are in the hospital.  Purchase a rear-facing car seat and make sure you know how to install it in your car.  Pack your hospital bag.  Prepare the baby's nursery. Make sure to remove all pillows and stuffed animals from the baby's crib to prevent suffocation.  Visit your dentist if you have not gone during your pregnancy. Use a soft toothbrush to brush your teeth and be gentle when you floss.  Keep all prenatal follow-up visits as told by your health care provider. This is important. Contact a health care provider if:  You are unsure if you are in labor or if your water has broken.  You become dizzy.  You have mild pelvic cramps, pelvic pressure, or nagging pain in your abdominal area.  You have lower back pain.  You have persistent nausea, vomiting, or diarrhea.  You have an unusual or bad smelling vaginal discharge.  You have pain when you urinate. Get help right away if:  You have a fever.  You are leaking fluid from your vagina.  You have spotting or bleeding from your vagina.  You have severe abdominal pain or cramping.  You have rapid weight loss or weight gain.  You have  shortness of breath with chest pain.  You notice sudden or extreme swelling of your face, hands, ankles, feet, or legs.  Your baby makes fewer than 10 movements in 2 hours.  You have severe headaches that do not go away with medicine.  You have vision changes. Summary  The third trimester is from week 29 through week 40, months 7 through 9. The third trimester is a time when the unborn baby (fetus)   is growing rapidly.  During the third trimester, your discomfort may increase as you and your baby continue to gain weight. You may have abdominal, leg, and back pain, sleeping problems, and an increased need to urinate.  During the third trimester your breasts will keep growing and they will continue to become tender. A yellow fluid (colostrum) may leak from your breasts. This is the first milk you are producing for your baby.  False labor is a condition in which you feel small, irregular tightenings of the muscles in the womb (contractions) that eventually go away. These are called Braxton Hicks contractions. Contractions may last for hours, days, or even weeks before true labor sets in.  Signs of labor can include: abdominal cramps; regular contractions that start at 10 minutes apart and become stronger and more frequent with time; watery or bloody mucus discharge that comes from the vagina; increased pelvic pressure and dull back pain; and leaking of amniotic fluid. This information is not intended to replace advice given to you by your health care provider. Make sure you discuss any questions you have with your health care provider. Document Released: 01/11/2001 Document Revised: 06/25/2015 Document Reviewed: 03/20/2012 [employer] Interactive Patient Education  2017 [employer]   Breastfeeding Deciding to breastfeed is one of the best choices you can make for you and your baby. A change in hormones during pregnancy causes your breast tissue to grow and increases the number and size of your  milk ducts. These hormones also allow proteins, sugars, and fats from your blood supply to make breast milk in your milk-producing glands. Hormones prevent breast milk from being released before your baby is born as well as prompt milk flow after birth. Once breastfeeding has begun, thoughts of your baby, as well as his or her sucking or crying, can stimulate the release of milk from your milk-producing glands. Benefits of breastfeeding For Your Baby  Your first milk (colostrum) helps your baby's digestive system function better.  There are antibodies in your milk that help your baby fight off infections.  Your baby has a lower incidence of asthma, allergies, and sudden infant death syndrome.  The nutrients in breast milk are better for your baby than infant formulas and are designed uniquely for your baby's needs.  Breast milk improves your baby's brain development.  Your baby is less likely to develop other conditions, such as childhood obesity, asthma, or type 2 diabetes mellitus. For You  Breastfeeding helps to create a very special bond between you and your baby.  Breastfeeding is convenient. Breast milk is always available at the correct temperature and costs nothing.  Breastfeeding helps to burn calories and helps you lose the weight gained during pregnancy.  Breastfeeding makes your uterus contract to its prepregnancy size faster and slows bleeding (lochia) after you give birth.  Breastfeeding helps to lower your risk of developing type 2 diabetes mellitus, osteoporosis, and breast or ovarian cancer later in life. Signs that your baby is hungry Early Signs of Hunger  Increased alertness or activity.  Stretching.  Movement of the head from side to side.  Movement of the head and opening of the mouth when the corner of the mouth or cheek is stroked (rooting).  Increased sucking sounds, smacking lips, cooing, sighing, or squeaking.  Hand-to-mouth movements.  Increased  sucking of fingers or hands. Late Signs of Hunger  Fussing.  Intermittent crying. Extreme Signs of Hunger  Signs of extreme hunger will require calming and consoling before your baby will   be able to breastfeed successfully. Do not wait for the following signs of extreme hunger to occur before you initiate breastfeeding:  Restlessness.  A loud, strong cry.  Screaming. Breastfeeding basics  Breastfeeding Initiation  Find a comfortable place to sit or lie down, with your neck and back well supported.  Place a pillow or rolled up blanket under your baby to bring him or her to the level of your breast (if you are seated). Nursing pillows are specially designed to help support your arms and your baby while you breastfeed.  Make sure that your baby's abdomen is facing your abdomen.  Gently massage your breast. With your fingertips, massage from your chest wall toward your nipple in a circular motion. This encourages milk flow. You may need to continue this action during the feeding if your milk flows slowly.  Support your breast with 4 fingers underneath and your thumb above your nipple. Make sure your fingers are well away from your nipple and your baby's mouth.  Stroke your baby's lips gently with your finger or nipple.  When your baby's mouth is open wide enough, quickly bring your baby to your breast, placing your entire nipple and as much of the colored area around your nipple (areola) as possible into your baby's mouth.  More areola should be visible above your baby's upper lip than below the lower lip.  Your baby's tongue should be between his or her lower gum and your breast.  Ensure that your baby's mouth is correctly positioned around your nipple (latched). Your baby's lips should create a seal on your breast and be turned out (everted).  It is common for your baby to suck about 2-3 minutes in order to start the flow of breast milk. Latching  Teaching your baby how to latch  on to your breast properly is very important. An improper latch can cause nipple pain and decreased milk supply for you and poor weight gain in your baby. Also, if your baby is not latched onto your nipple properly, he or she may swallow some air during feeding. This can make your baby fussy. Burping your baby when you switch breasts during the feeding can help to get rid of the air. However, teaching your baby to latch on properly is still the best way to prevent fussiness from swallowing air while breastfeeding. Signs that your baby has successfully latched on to your nipple:  Silent tugging or silent sucking, without causing you pain.  Swallowing heard between every 3-4 sucks.  Muscle movement above and in front of his or her ears while sucking. Signs that your baby has not successfully latched on to nipple:  Sucking sounds or smacking sounds from your baby while breastfeeding.  Nipple pain. If you think your baby has not latched on correctly, slip your finger into the corner of your baby's mouth to break the suction and place it between your baby's gums. Attempt breastfeeding initiation again. Signs of Successful Breastfeeding  Signs from your baby:  A gradual decrease in the number of sucks or complete cessation of sucking.  Falling asleep.  Relaxation of his or her body.  Retention of a small amount of milk in his or her mouth.  Letting go of your breast by himself or herself. Signs from you:  Breasts that have increased in firmness, weight, and size 1-3 hours after feeding.  Breasts that are softer immediately after breastfeeding.  Increased milk volume, as well as a change in milk consistency and color by   the fifth day of breastfeeding.  Nipples that are not sore, cracked, or bleeding. Signs That Your Baby is Getting Enough Milk  Wetting at least 1-2 diapers during the first 24 hours after birth.  Wetting at least 5-6 diapers every 24 hours for the first week after  birth. The urine should be clear or pale yellow by 5 days after birth.  Wetting 6-8 diapers every 24 hours as your baby continues to grow and develop.  At least 3 stools in a 24-hour period by age 5 days. The stool should be soft and yellow.  At least 3 stools in a 24-hour period by age 7 days. The stool should be seedy and yellow.  No loss of weight greater than 10% of birth weight during the first 3 days of age.  Average weight gain of 4-7 ounces (113-198 g) per week after age 4 days.  Consistent daily weight gain by age 5 days, without weight loss after the age of 2 weeks. After a feeding, your baby may spit up a small amount. This is common. Breastfeeding frequency and duration Frequent feeding will help you make more milk and can prevent sore nipples and breast engorgement. Breastfeed when you feel the need to reduce the fullness of your breasts or when your baby shows signs of hunger. This is called "breastfeeding on demand." Avoid introducing a pacifier to your baby while you are working to establish breastfeeding (the first 4-6 weeks after your baby is born). After this time you may choose to use a pacifier. Research has shown that pacifier use during the first year of a baby's life decreases the risk of sudden infant death syndrome (SIDS). Allow your baby to feed on each breast as long as he or she wants. Breastfeed until your baby is finished feeding. When your baby unlatches or falls asleep while feeding from the first breast, offer the second breast. Because newborns are often sleepy in the first few weeks of life, you may need to awaken your baby to get him or her to feed. Breastfeeding times will vary from baby to baby. However, the following rules can serve as a guide to help you ensure that your baby is properly fed:  Newborns (babies 4 weeks of age or younger) may breastfeed every 1-3 hours.  Newborns should not go longer than 3 hours during the day or 5 hours during the night  without breastfeeding.  You should breastfeed your baby a minimum of 8 times in a 24-hour period until you begin to introduce solid foods to your baby at around 6 months of age. Breast milk pumping Pumping and storing breast milk allows you to ensure that your baby is exclusively fed your breast milk, even at times when you are unable to breastfeed. This is especially important if you are going back to work while you are still breastfeeding or when you are not able to be present during feedings. Your lactation consultant can give you guidelines on how long it is safe to store breast milk. A breast pump is a machine that allows you to pump milk from your breast into a sterile bottle. The pumped breast milk can then be stored in a refrigerator or freezer. Some breast pumps are operated by hand, while others use electricity. Ask your lactation consultant which type will work best for you. Breast pumps can be purchased, but some hospitals and breastfeeding support groups lease breast pumps on a monthly basis. A lactation consultant can teach you how to   hand express breast milk, if you prefer not to use a pump. Caring for your breasts while you breastfeed Nipples can become dry, cracked, and sore while breastfeeding. The following recommendations can help keep your breasts moisturized and healthy:  Avoid using soap on your nipples.  Wear a supportive bra. Although not required, special nursing bras and tank tops are designed to allow access to your breasts for breastfeeding without taking off your entire bra or top. Avoid wearing underwire-style bras or extremely tight bras.  Air dry your nipples for 3-4minutes after each feeding.  Use only cotton bra pads to absorb leaked breast milk. Leaking of breast milk between feedings is normal.  Use lanolin on your nipples after breastfeeding. Lanolin helps to maintain your skin's normal moisture barrier. If you use pure lanolin, you do not need to wash it off  before feeding your baby again. Pure lanolin is not toxic to your baby. You may also hand express a few drops of breast milk and gently massage that milk into your nipples and allow the milk to air dry. In the first few weeks after giving birth, some women experience extremely full breasts (engorgement). Engorgement can make your breasts feel heavy, warm, and tender to the touch. Engorgement peaks within 3-5 days after you give birth. The following recommendations can help ease engorgement:  Completely empty your breasts while breastfeeding or pumping. You may want to start by applying warm, moist heat (in the shower or with warm water-soaked hand towels) just before feeding or pumping. This increases circulation and helps the milk flow. If your baby does not completely empty your breasts while breastfeeding, pump any extra milk after he or she is finished.  Wear a snug bra (nursing or regular) or tank top for 1-2 days to signal your body to slightly decrease milk production.  Apply ice packs to your breasts, unless this is too uncomfortable for you.  Make sure that your baby is latched on and positioned properly while breastfeeding. If engorgement persists after 48 hours of following these recommendations, contact your health care provider or a lactation consultant. Overall health care recommendations while breastfeeding  Eat healthy foods. Alternate between meals and snacks, eating 3 of each per day. Because what you eat affects your breast milk, some of the foods may make your baby more irritable than usual. Avoid eating these foods if you are sure that they are negatively affecting your baby.  Drink milk, fruit juice, and water to satisfy your thirst (about 10 glasses a day).  Rest often, relax, and continue to take your prenatal vitamins to prevent fatigue, stress, and anemia.  Continue breast self-awareness checks.  Avoid chewing and smoking tobacco. Chemicals from cigarettes that pass  into breast milk and exposure to secondhand smoke may harm your baby.  Avoid alcohol and drug use, including marijuana. Some medicines that may be harmful to your baby can pass through breast milk. It is important to ask your health care provider before taking any medicine, including all over-the-counter and prescription medicine as well as vitamin and herbal supplements. It is possible to become pregnant while breastfeeding. If birth control is desired, ask your health care provider about options that will be safe for your baby. Contact a health care provider if:  You feel like you want to stop breastfeeding or have become frustrated with breastfeeding.  You have painful breasts or nipples.  Your nipples are cracked or bleeding.  Your breasts are red, tender, or warm.  You have   a swollen area on either breast.  You have a fever or chills.  You have nausea or vomiting.  You have drainage other than breast milk from your nipples.  Your breasts do not become full before feedings by the fifth day after you give birth.  You feel sad and depressed.  Your baby is too sleepy to eat well.  Your baby is having trouble sleeping.  Your baby is wetting less than 3 diapers in a 24-hour period.  Your baby has less than 3 stools in a 24-hour period.  Your baby's skin or the white part of his or her eyes becomes yellow.  Your baby is not gaining weight by 5 days of age. Get help right away if:  Your baby is overly tired (lethargic) and does not want to wake up and feed.  Your baby develops an unexplained fever. This information is not intended to replace advice given to you by your health care provider. Make sure you discuss any questions you have with your health care provider. Document Released: 01/17/2005 Document Revised: 07/01/2015 Document Reviewed: 07/11/2012 Elsevier Interactive Patient Education  2017 Elsevier Inc.  

## 2016-02-05 ENCOUNTER — Encounter (HOSPITAL_COMMUNITY): Payer: Self-pay

## 2016-02-05 ENCOUNTER — Other Ambulatory Visit (HOSPITAL_COMMUNITY): Payer: Self-pay | Admitting: *Deleted

## 2016-02-05 ENCOUNTER — Telehealth: Payer: Self-pay

## 2016-02-05 DIAGNOSIS — O24410 Gestational diabetes mellitus in pregnancy, diet controlled: Secondary | ICD-10-CM

## 2016-02-05 NOTE — Telephone Encounter (Signed)
Received a staff message from Dr. Shawnie PonsPratt [facility] [place]. Called patient, no answer, left voicemail instructing patient to return my call at the office on Monday to get scheduled for an appointment.

## 2016-02-12 ENCOUNTER — Ambulatory Visit (INDEPENDENT_AMBULATORY_CARE_PROVIDER_SITE_OTHER): Payer: Medicaid Other | Admitting: Clinical

## 2016-02-12 ENCOUNTER — Ambulatory Visit (INDEPENDENT_AMBULATORY_CARE_PROVIDER_SITE_OTHER): Payer: Medicaid Other | Admitting: *Deleted

## 2016-02-12 DIAGNOSIS — F332 Major depressive disorder, recurrent severe without psychotic features: Secondary | ICD-10-CM

## 2016-02-12 DIAGNOSIS — O09899 Supervision of other high risk pregnancies, unspecified trimester: Secondary | ICD-10-CM

## 2016-02-12 DIAGNOSIS — O09219 Supervision of pregnancy with history of pre-term labor, unspecified trimester: Secondary | ICD-10-CM | POA: Diagnosis present

## 2016-02-12 MED ORDER — HYDROXYPROGESTERONE CAPROATE 250 MG/ML IM OIL
250.0000 mg | TOPICAL_OIL | Freq: Once | INTRAMUSCULAR | Status: AC
  Administered 2016-02-12: 250 mg via INTRAMUSCULAR

## 2016-02-12 NOTE — BH Specialist Note (Signed)
Session Start time: 10:40   End Time: 10:56 Total Time:  16 minutes Type of Service: Behavioral Health - Individual/Family Interpreter: No.   Interpreter Name & Language: n/a # [facility]June [facility]nd  SUBJECTIVE: Sarah Contreras is a 31 y.o. female  Pt. was referred by f/u for:  anxiety and depression. Pt. reports the following symptoms/concerns: Pt states that she has tried "everything" that was discussed at last visit, but that she has not slept in 4 days, no over-the-counter sleep aids have helped; attributes increased stress level to recent job loss. Duration of problem:  Increase in one week Severity: severe Previous treatment: Has psychiatrist in [place], Dr. Dierdre SearlesLi; will continue with Dr. Dierdre SearlesLi postpartum  OBJECTIVE: Mood: Depressed & Affect: Depressed Risk of harm to self or others: No known risk of harm to self or others Assessments administered: PHQ9: 22/ GAD7: 16  LIFE CONTEXT:  Family & Social: Lives with partner School/ Work: Unemployed Self-Care: No sleep in 4 days Life changes: Recent job loss What is important to pt/family (values): Family and Staying healthy  GOALS ADDRESSED:  -Alleviate symptoms of anxiety and depression  INTERVENTIONS: Strength-based   ASSESSMENT:  Pt currently experiencing Major depressive disorder, moderate, currently active.  Pt may benefit from brief therapeutic intervention regarding coping with depression.   PLAN: 1. F/U with behavioral health clinician: Two weeks 2. Behavioral Health meds: Zoloft 3. Behavioral recommendations:  -Pick up medication today(Zanax) for sleep, and take as prescribed by medical provider 4. Referral: Brief Counseling/Psychotherapy 5. From scale of 1-10, how likely are you to follow plan: 9  Woc-Behavioral Health Clinician  Behavioral Health Clinician  Warmhandoff: no  Depression screen [facility] 2/9 02/12/2016 02/04/2016 12/03/2015 11/12/2015  Decreased Interest 3 2 1 2   Down, Depressed, Hopeless 3 2  - 2  PHQ - 2 Score 6 4 1 4   Altered sleeping 3 3 2 2   Tired, decreased energy 3 2 3 2   Change in appetite 2 2 3 2   Feeling bad or failure about yourself  3 2 1 1   Trouble concentrating 2 1 1 1   Moving slowly or fidgety/restless 3 1 2 2   Suicidal thoughts 0 0 0 0  PHQ-9 Score 22 15 13 14    GAD 7 : Generalized Anxiety Score 02/12/2016 02/04/2016 12/03/2015 11/12/2015  Nervous, Anxious, on Edge 3 3 1 3   Control/stop worrying 2 2 1 1   Worry too much - different things 2 2 1 1   Trouble relaxing 3 3 2 3   Restless 3 2 1 2   Easily annoyed or irritable 2 1 1 1   Afraid - awful might happen 1 1 1 1   Total GAD 7 Score 16 14 8  12

## 2016-02-18 ENCOUNTER — Encounter: Payer: Self-pay | Admitting: Obstetrics and Gynecology

## 2016-02-18 ENCOUNTER — Encounter: Payer: Self-pay | Admitting: Family Medicine

## 2016-02-19 ENCOUNTER — Ambulatory Visit: Payer: Self-pay

## 2016-02-22 ENCOUNTER — Observation Stay
Admission: EM | Admit: 2016-02-22 | Discharge: 2016-02-22 | Disposition: A | Discharge status: Home / Self Care | Payer: Medicaid Other | Attending: Obstetrics and Gynecology | Admitting: Obstetrics and Gynecology

## 2016-02-22 ENCOUNTER — Observation Stay: Payer: Medicaid Other

## 2016-02-22 ENCOUNTER — Encounter: Payer: Self-pay | Admitting: *Deleted

## 2016-02-22 DIAGNOSIS — O0990 Supervision of high risk pregnancy, unspecified, unspecified trimester: Secondary | ICD-10-CM

## 2016-02-22 DIAGNOSIS — O34219 Maternal care for unspecified type scar from previous cesarean delivery: Secondary | ICD-10-CM | POA: Diagnosis not present

## 2016-02-22 DIAGNOSIS — O09213 Supervision of pregnancy with history of pre-term labor, third trimester: Principal | ICD-10-CM | POA: Insufficient documentation

## 2016-02-22 DIAGNOSIS — O09219 Supervision of pregnancy with history of pre-term labor, unspecified trimester: Secondary | ICD-10-CM

## 2016-02-22 DIAGNOSIS — O09899 Supervision of other high risk pregnancies, unspecified trimester: Secondary | ICD-10-CM

## 2016-02-22 DIAGNOSIS — O9989 Other specified diseases and conditions complicating pregnancy, childbirth and the puerperium: Secondary | ICD-10-CM | POA: Diagnosis present

## 2016-02-22 DIAGNOSIS — Z3A33 33 weeks gestation of pregnancy: Secondary | ICD-10-CM | POA: Diagnosis not present

## 2016-02-22 DIAGNOSIS — O4290 Premature rupture of membranes, unspecified as to length of time between rupture and onset of labor, unspecified weeks of gestation: Secondary | ICD-10-CM

## 2016-02-22 LAB — URINALYSIS, ROUTINE W REFLEX MICROSCOPIC
Bilirubin Urine: NEGATIVE
GLUCOSE, UA: NEGATIVE mg/dL
HGB URINE DIPSTICK: NEGATIVE
KETONES UR: NEGATIVE mg/dL
Leukocytes, UA: NEGATIVE
Nitrite: NEGATIVE
PH: 7 (ref 5.0–8.0)
PROTEIN: NEGATIVE mg/dL
Specific Gravity, Urine: 1.005 (ref 1.005–1.030)

## 2016-02-22 LAB — WET PREP, GENITAL
Clue Cells Wet Prep HPF POC: NONE SEEN
SPERM: NONE SEEN
Trich, Wet Prep: NONE SEEN
Yeast Wet Prep HPF POC: NONE SEEN

## 2016-02-22 LAB — CHLAMYDIA/NGC RT PCR (ARMC ONLY)
CHLAMYDIA TR: NOT DETECTED
N gonorrhoeae: NOT DETECTED

## 2016-02-22 LAB — FETAL FIBRONECTIN: FETAL FIBRONECTIN: NEGATIVE

## 2016-02-22 LAB — RUPTURE OF MEMBRANE (ROM)PLUS: Rom Plus: NEGATIVE

## 2016-02-22 NOTE — OB Triage Provider Note (Signed)
TRIAGE VISIT with NST  CC: Leaking fluid in pregnancy   Sarah Contreras is a 31 y.o. [id number]. She is at [redacted]w[redacted]d gestation. She reports vaginal pain that started on Friday, sharply, and intermittent abdominal cramping. She has a hx of preterm delivery at 27 wks and is followed by the high-risk MFM at [facility].  She denies dysuria, diarrhea, constipation, SOB, vaginal discharge. She endorses headache with a hx of chronic migraines. She endorses lower back pain.   Hx of prior classical c/s at 27wks for preterm labor- on 17-P this pregnancy. She is scheduled for repeat c/s at 03/16/16. Hx of possible preE per patients report with last delivery.  U/S with AFI 16, closed appearing cervix and negative ROM-plus for rupture.   She is contracting occasionally but not feeling them.   S: Resting comfortably. no CTX, no VB. Active fetal movement. Concerned about leaking   O:  BP 122/75 (BP Location: Left Arm)   Pulse (!) 107   Temp 97.3 F (36.3 C) (Oral)   Resp 18   Ht 5\' 1"  (1.549 m)   Wt 198 lb (89.8 kg)   LMP 07/01/2015 (Exact Date)   BMI 37.41 kg/m  Results for orders placed or performed during the hospital encounter of 02/22/16 (from the past 48 hour(s))  Urinalysis, Routine w reflex microscopic   Collection Time: 02/22/16  9:49 AM  Result Value Ref Range   Color, Urine YELLOW (A) YELLOW   APPearance CLEAR (A) CLEAR   Specific Gravity, Urine 1.005 1.005 - 1.030   pH 7.0 5.0 - 8.0   Glucose, UA NEGATIVE NEGATIVE mg/dL   Hgb urine dipstick NEGATIVE NEGATIVE   Bilirubin Urine NEGATIVE NEGATIVE   Ketones, ur NEGATIVE NEGATIVE mg/dL   Protein, ur NEGATIVE NEGATIVE mg/dL   Nitrite NEGATIVE NEGATIVE   Leukocytes, UA NEGATIVE NEGATIVE  ROM Plus ([facility] only)   Collection Time: 02/22/16  9:50 AM  Result Value Ref Range   Rom Plus NEGATIVE       CLINICAL DATA:  Leaking amniotic fluid for 4 hours ; assigned EGA [redacted] weeks 5 days  EXAM: LIMITED OBSTETRIC  ULTRASOUND  FINDINGS: Number of Fetuses: 1  Heart Rate:  136 bpm  Movement: Present  Presentation: Cephalic  Placental Location: Anterior  Previa: Absent  Amniotic Fluid (Subjective):  Within normal limits.  AFI:  16.3 cm  BPD:  9.02cm 36w  4d  MATERNAL FINDINGS:  Cervix:  Appears closed, 4 cm length.  Uterus/Adnexae:  No abnormality visualized.  IMPRESSION: Single live intrauterine gestation as above.  Subjectively normal amniotic fluid volume with AFI = 16.3 cm.  This exam is performed on an emergent basis and does not comprehensively evaluate fetal size, dating, or anatomy; follow-up complete OB US should be considered if further fetal assessment is warranted.   Electronically Signed   By: Ulyses Southward M.D.   On: 02/22/2016 11:21  ___________________________ Gen: NAD, AAOx3      Abd: FNTTP      Ext: Non-tender, Nonedmeatous   Cervix: closed and long Vagina: white clumpy discharge  NST  Baseline: 120 Variability: moderate Accelerations present x >2 Decelerations absent Time  Interpretation: reactive NST, category 1 tracing  -----A/P:  31 y.o. G2P0101 at [redacted]w[redacted]d with R/o ROM.   Labor: not present. FFN neg, cervix closed and long.  R/o ROM: SSE negative x 3. Wet prep neg for yeast and clue cells. Normal amount of white discharge. AFI 2 x 2 cm SDP. Perfectly normal fluid.  FWB: NST is Reassuring Cat 1 tracing.  D/c home stable, precautions reviewed, follow-up as scheduled.
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
## 2016-02-25 ENCOUNTER — Ambulatory Visit (INDEPENDENT_AMBULATORY_CARE_PROVIDER_SITE_OTHER): Payer: Medicaid Other | Admitting: Family Medicine

## 2016-02-25 VITALS — BP 114/70 | HR 100 | Wt 201.2 lb

## 2016-02-25 DIAGNOSIS — O09899 Supervision of other high risk pregnancies, unspecified trimester: Secondary | ICD-10-CM

## 2016-02-25 DIAGNOSIS — O34219 Maternal care for unspecified type scar from previous cesarean delivery: Secondary | ICD-10-CM | POA: Diagnosis not present

## 2016-02-25 DIAGNOSIS — O0990 Supervision of high risk pregnancy, unspecified, unspecified trimester: Secondary | ICD-10-CM

## 2016-02-25 DIAGNOSIS — F5101 Primary insomnia: Secondary | ICD-10-CM

## 2016-02-25 DIAGNOSIS — O24410 Gestational diabetes mellitus in pregnancy, diet controlled: Secondary | ICD-10-CM | POA: Diagnosis present

## 2016-02-25 DIAGNOSIS — O09213 Supervision of pregnancy with history of pre-term labor, third trimester: Secondary | ICD-10-CM

## 2016-02-25 DIAGNOSIS — O09219 Supervision of pregnancy with history of pre-term labor, unspecified trimester: Secondary | ICD-10-CM

## 2016-02-25 MED ORDER — HYDROXYPROGESTERONE CAPROATE 250 MG/ML IM OIL
250.0000 mg | TOPICAL_OIL | INTRAMUSCULAR | Status: DC
  Administered 2016-02-25: 250 mg via INTRAMUSCULAR

## 2016-02-25 MED ORDER — ZOLPIDEM TARTRATE 5 MG PO TABS: 5.0000 mg | ORAL_TABLET | Freq: Every evening | ORAL | 0 refills | Status: DC | PRN

## 2016-02-25 NOTE — Patient Instructions (Signed)
Third Trimester of Pregnancy The third trimester is from week 29 through week 40 (months 7 through 9). The third trimester is a time when the unborn baby (fetus) is growing rapidly. At the end of the ninth month, the fetus is about 20 inches in length and weighs 6-10 pounds. Body changes during your third trimester Your body goes through many changes during pregnancy. The changes vary from woman to woman. During the third trimester:  Your weight will continue to increase. You can expect to gain 25-35 pounds (11-16 kg) by the end of the pregnancy.  You may begin to get stretch marks on your hips, abdomen, and breasts.  You may urinate more often because the fetus is moving lower into your pelvis and pressing on your bladder.  You may develop or continue to have heartburn. This is caused by increased hormones that slow down muscles in the digestive tract.  You may develop or continue to have constipation because increased hormones slow digestion and cause the muscles that push waste through your intestines to relax.  You may develop hemorrhoids. These are swollen veins (varicose veins) in the rectum that can itch or be painful.  You may develop swollen, bulging veins (varicose veins) in your legs.  You may have increased body aches in the pelvis, back, or thighs. This is due to weight gain and increased hormones that are relaxing your joints.  You may have changes in your hair. These can include thickening of your hair, rapid growth, and changes in texture. Some women also have hair loss during or after pregnancy, or hair that feels dry or thin. Your hair will most likely return to normal after your baby is born.  Your breasts will continue to grow and they will continue to become tender. A yellow fluid (colostrum) may leak from your breasts. This is the first milk you are producing for your baby.  Your belly button may stick out.  You may notice more swelling in your hands, face, or  ankles.  You may have increased tingling or numbness in your hands, arms, and legs. The skin on your belly may also feel numb.  You may feel short of breath because of your expanding uterus.  You may have more problems sleeping. This can be caused by the size of your belly, increased need to urinate, and an increase in your body's metabolism.  You may notice the fetus "dropping," or moving lower in your abdomen.  You may have increased vaginal discharge.  Your cervix becomes thin and soft (effaced) near your due date. What to expect at prenatal visits You will have prenatal exams every 2 weeks until week 36. Then you will have weekly prenatal exams. During a routine prenatal visit:  You will be weighed to make sure you and the fetus are growing normally.  Your blood pressure will be taken.  Your abdomen will be measured to track your baby's growth.  The fetal heartbeat will be listened to.  Any test results from the previous visit will be discussed.  You may have a cervical check near your due date to see if you have effaced. At around 36 weeks, your health care provider will check your cervix. At the same time, your health care provider will also perform a test on the secretions of the vaginal tissue. This test is to determine if a type of bacteria, Group B streptococcus, is present. Your health care provider will explain this further. Your health care provider may ask you:    What your birth plan is.  How you are feeling.  If you are feeling the baby move.  If you have had any abnormal symptoms, such as leaking fluid, bleeding, severe headaches, or abdominal cramping.  If you are using any tobacco products, including cigarettes, chewing tobacco, and electronic cigarettes.  If you have any questions. Other tests or screenings that may be performed during your third trimester include:  Blood tests that check for low iron levels (anemia).  Fetal testing to check the health,  activity level, and growth of the fetus. Testing is done if you have certain medical conditions or if there are problems during the pregnancy.  Nonstress test (NST). This test checks the health of your baby to make sure there are no signs of problems, such as the baby not getting enough oxygen. During this test, a belt is placed around your belly. The baby is made to move, and its heart rate is monitored during movement. What is false labor? False labor is a condition in which you feel small, irregular tightenings of the muscles in the womb (contractions) that eventually go away. These are called Braxton Hicks contractions. Contractions may last for hours, days, or even weeks before true labor sets in. If contractions come at regular intervals, become more frequent, increase in intensity, or become painful, you should see your health care provider. What are the signs of labor?  Abdominal cramps.  Regular contractions that start at 10 minutes apart and become stronger and more frequent with time.  Contractions that start on the top of the uterus and spread down to the lower abdomen and back.  Increased pelvic pressure and dull back pain.  A watery or bloody mucus discharge that comes from the vagina.  Leaking of amniotic fluid. This is also known as your "water breaking." It could be a slow trickle or a gush. Let your doctor know if it has a color or strange odor. If you have any of these signs, call your health care provider right away, even if it is before your due date. Follow these instructions at home: Eating and drinking  Continue to eat regular, healthy meals.  Do not eat:  Raw meat or meat spreads.  Unpasteurized milk or cheese.  Unpasteurized juice.  Store-made salad.  Refrigerated smoked seafood.  Hot dogs or deli meat, unless they are piping hot.  More than 6 ounces of albacore tuna a week.  Shark, swordfish, king mackerel, or tile fish.  Store-made salads.  Raw  sprouts, such as mung bean or alfalfa sprouts.  Take prenatal vitamins as told by your health care provider.  Take 1000 mg of calcium daily as told by your health care provider.  If you develop constipation:  Take over-the-counter or prescription medicines.  Drink enough fluid to keep your urine clear or pale yellow.  Eat foods that are high in fiber, such as fresh fruits and vegetables, whole grains, and beans.  Limit foods that are high in fat and processed sugars, such as fried and sweet foods. Activity  Exercise only as directed by your health care provider. Healthy pregnant women should aim for 2 hours and 30 minutes of moderate exercise per week. If you experience any pain or discomfort while exercising, stop.  Avoid heavy lifting.  Do not exercise in extreme heat or humidity, or at high altitudes.  Wear low-heel, comfortable shoes.  Practice good posture.  Do not travel far distances unless it is absolutely necessary and only with the approval   of your health care provider.  Wear your seat belt at all times while in a car, on a bus, or on a plane.  Take frequent breaks and rest with your legs elevated if you have leg cramps or low back pain.  Do not use hot tubs, steam rooms, or saunas.  You may continue to have sex unless your health care provider tells you otherwise. Lifestyle  Do not use any products that contain nicotine or tobacco, such as cigarettes and e-cigarettes. If you need help quitting, ask your health care provider.  Do not drink alcohol.  Do not use any medicinal herbs or unprescribed drugs. These chemicals affect the formation and growth of the baby.  If you develop varicose veins:  Wear support pantyhose or compression stockings as told by your healthcare provider.  Elevate your feet for 15 minutes, 3-4 times a day.  Wear a supportive maternity bra to help with breast tenderness. General instructions  Take over-the-counter and prescription  medicines only as told by your health care provider. There are medicines that are either safe or unsafe to take during pregnancy.  Take warm sitz baths to soothe any pain or discomfort caused by hemorrhoids. Use hemorrhoid cream or witch hazel if your health care provider approves.  Avoid cat litter boxes and soil used by cats. These carry germs that can cause birth defects in the baby. If you have a cat, ask someone to clean the litter box for you.  To prepare for the arrival of your baby:  Take prenatal classes to understand, practice, and ask questions about the labor and delivery.  Make a trial run to the hospital.  Visit the hospital and tour the maternity area.  Arrange for maternity or paternity leave through employers.  Arrange for family and friends to take care of pets while you are in the hospital.  Purchase a rear-facing car seat and make sure you know how to install it in your car.  Pack your hospital bag.  Prepare the baby's nursery. Make sure to remove all pillows and stuffed animals from the baby's crib to prevent suffocation.  Visit your dentist if you have not gone during your pregnancy. Use a soft toothbrush to brush your teeth and be gentle when you floss.  Keep all prenatal follow-up visits as told by your health care provider. This is important. Contact a health care provider if:  You are unsure if you are in labor or if your water has broken.  You become dizzy.  You have mild pelvic cramps, pelvic pressure, or nagging pain in your abdominal area.  You have lower back pain.  You have persistent nausea, vomiting, or diarrhea.  You have an unusual or bad smelling vaginal discharge.  You have pain when you urinate. Get help right away if:  You have a fever.  You are leaking fluid from your vagina.  You have spotting or bleeding from your vagina.  You have severe abdominal pain or cramping.  You have rapid weight loss or weight gain.  You have  shortness of breath with chest pain.  You notice sudden or extreme swelling of your face, hands, ankles, feet, or legs.  Your baby makes fewer than 10 movements in 2 hours.  You have severe headaches that do not go away with medicine.  You have vision changes. Summary  The third trimester is from week 29 through week 40, months 7 through 9. The third trimester is a time when the unborn baby (fetus)   is growing rapidly.  During the third trimester, your discomfort may increase as you and your baby continue to gain weight. You may have abdominal, leg, and back pain, sleeping problems, and an increased need to urinate.  During the third trimester your breasts will keep growing and they will continue to become tender. A yellow fluid (colostrum) may leak from your breasts. This is the first milk you are producing for your baby.  False labor is a condition in which you feel small, irregular tightenings of the muscles in the womb (contractions) that eventually go away. These are called Braxton Hicks contractions. Contractions may last for hours, days, or even weeks before true labor sets in.  Signs of labor can include: abdominal cramps; regular contractions that start at 10 minutes apart and become stronger and more frequent with time; watery or bloody mucus discharge that comes from the vagina; increased pelvic pressure and dull back pain; and leaking of amniotic fluid. This information is not intended to replace advice given to you by your health care provider. Make sure you discuss any questions you have with your health care provider. Document Released: 01/11/2001 Document Revised: 06/25/2015 Document Reviewed: 03/20/2012 [employer] Interactive Patient Education  2017 [employer]   Breastfeeding Deciding to breastfeed is one of the best choices you can make for you and your baby. A change in hormones during pregnancy causes your breast tissue to grow and increases the number and size of your  milk ducts. These hormones also allow proteins, sugars, and fats from your blood supply to make breast milk in your milk-producing glands. Hormones prevent breast milk from being released before your baby is born as well as prompt milk flow after birth. Once breastfeeding has begun, thoughts of your baby, as well as his or her sucking or crying, can stimulate the release of milk from your milk-producing glands. Benefits of breastfeeding For Your Baby  Your first milk (colostrum) helps your baby's digestive system function better.  There are antibodies in your milk that help your baby fight off infections.  Your baby has a lower incidence of asthma, allergies, and sudden infant death syndrome.  The nutrients in breast milk are better for your baby than infant formulas and are designed uniquely for your baby's needs.  Breast milk improves your baby's brain development.  Your baby is less likely to develop other conditions, such as childhood obesity, asthma, or type 2 diabetes mellitus. For You  Breastfeeding helps to create a very special bond between you and your baby.  Breastfeeding is convenient. Breast milk is always available at the correct temperature and costs nothing.  Breastfeeding helps to burn calories and helps you lose the weight gained during pregnancy.  Breastfeeding makes your uterus contract to its prepregnancy size faster and slows bleeding (lochia) after you give birth.  Breastfeeding helps to lower your risk of developing type 2 diabetes mellitus, osteoporosis, and breast or ovarian cancer later in life. Signs that your baby is hungry Early Signs of Hunger  Increased alertness or activity.  Stretching.  Movement of the head from side to side.  Movement of the head and opening of the mouth when the corner of the mouth or cheek is stroked (rooting).  Increased sucking sounds, smacking lips, cooing, sighing, or squeaking.  Hand-to-mouth movements.  Increased  sucking of fingers or hands. Late Signs of Hunger  Fussing.  Intermittent crying. Extreme Signs of Hunger  Signs of extreme hunger will require calming and consoling before your baby will   be able to breastfeed successfully. Do not wait for the following signs of extreme hunger to occur before you initiate breastfeeding:  Restlessness.  A loud, strong cry.  Screaming. Breastfeeding basics  Breastfeeding Initiation  Find a comfortable place to sit or lie down, with your neck and back well supported.  Place a pillow or rolled up blanket under your baby to bring him or her to the level of your breast (if you are seated). Nursing pillows are specially designed to help support your arms and your baby while you breastfeed.  Make sure that your baby's abdomen is facing your abdomen.  Gently massage your breast. With your fingertips, massage from your chest wall toward your nipple in a circular motion. This encourages milk flow. You may need to continue this action during the feeding if your milk flows slowly.  Support your breast with 4 fingers underneath and your thumb above your nipple. Make sure your fingers are well away from your nipple and your baby's mouth.  Stroke your baby's lips gently with your finger or nipple.  When your baby's mouth is open wide enough, quickly bring your baby to your breast, placing your entire nipple and as much of the colored area around your nipple (areola) as possible into your baby's mouth.  More areola should be visible above your baby's upper lip than below the lower lip.  Your baby's tongue should be between his or her lower gum and your breast.  Ensure that your baby's mouth is correctly positioned around your nipple (latched). Your baby's lips should create a seal on your breast and be turned out (everted).  It is common for your baby to suck about 2-3 minutes in order to start the flow of breast milk. Latching  Teaching your baby how to latch  on to your breast properly is very important. An improper latch can cause nipple pain and decreased milk supply for you and poor weight gain in your baby. Also, if your baby is not latched onto your nipple properly, he or she may swallow some air during feeding. This can make your baby fussy. Burping your baby when you switch breasts during the feeding can help to get rid of the air. However, teaching your baby to latch on properly is still the best way to prevent fussiness from swallowing air while breastfeeding. Signs that your baby has successfully latched on to your nipple:  Silent tugging or silent sucking, without causing you pain.  Swallowing heard between every 3-4 sucks.  Muscle movement above and in front of his or her ears while sucking. Signs that your baby has not successfully latched on to nipple:  Sucking sounds or smacking sounds from your baby while breastfeeding.  Nipple pain. If you think your baby has not latched on correctly, slip your finger into the corner of your baby's mouth to break the suction and place it between your baby's gums. Attempt breastfeeding initiation again. Signs of Successful Breastfeeding  Signs from your baby:  A gradual decrease in the number of sucks or complete cessation of sucking.  Falling asleep.  Relaxation of his or her body.  Retention of a small amount of milk in his or her mouth.  Letting go of your breast by himself or herself. Signs from you:  Breasts that have increased in firmness, weight, and size 1-3 hours after feeding.  Breasts that are softer immediately after breastfeeding.  Increased milk volume, as well as a change in milk consistency and color by   the fifth day of breastfeeding.  Nipples that are not sore, cracked, or bleeding. Signs That Your Baby is Getting Enough Milk  Wetting at least 1-2 diapers during the first 24 hours after birth.  Wetting at least 5-6 diapers every 24 hours for the first week after  birth. The urine should be clear or pale yellow by 5 days after birth.  Wetting 6-8 diapers every 24 hours as your baby continues to grow and develop.  At least 3 stools in a 24-hour period by age 5 days. The stool should be soft and yellow.  At least 3 stools in a 24-hour period by age 7 days. The stool should be seedy and yellow.  No loss of weight greater than 10% of birth weight during the first 3 days of age.  Average weight gain of 4-7 ounces (113-198 g) per week after age 4 days.  Consistent daily weight gain by age 5 days, without weight loss after the age of 2 weeks. After a feeding, your baby may spit up a small amount. This is common. Breastfeeding frequency and duration Frequent feeding will help you make more milk and can prevent sore nipples and breast engorgement. Breastfeed when you feel the need to reduce the fullness of your breasts or when your baby shows signs of hunger. This is called "breastfeeding on demand." Avoid introducing a pacifier to your baby while you are working to establish breastfeeding (the first 4-6 weeks after your baby is born). After this time you may choose to use a pacifier. Research has shown that pacifier use during the first year of a baby's life decreases the risk of sudden infant death syndrome (SIDS). Allow your baby to feed on each breast as long as he or she wants. Breastfeed until your baby is finished feeding. When your baby unlatches or falls asleep while feeding from the first breast, offer the second breast. Because newborns are often sleepy in the first few weeks of life, you may need to awaken your baby to get him or her to feed. Breastfeeding times will vary from baby to baby. However, the following rules can serve as a guide to help you ensure that your baby is properly fed:  Newborns (babies 4 weeks of age or younger) may breastfeed every 1-3 hours.  Newborns should not go longer than 3 hours during the day or 5 hours during the night  without breastfeeding.  You should breastfeed your baby a minimum of 8 times in a 24-hour period until you begin to introduce solid foods to your baby at around 6 months of age. Breast milk pumping Pumping and storing breast milk allows you to ensure that your baby is exclusively fed your breast milk, even at times when you are unable to breastfeed. This is especially important if you are going back to work while you are still breastfeeding or when you are not able to be present during feedings. Your lactation consultant can give you guidelines on how long it is safe to store breast milk. A breast pump is a machine that allows you to pump milk from your breast into a sterile bottle. The pumped breast milk can then be stored in a refrigerator or freezer. Some breast pumps are operated by hand, while others use electricity. Ask your lactation consultant which type will work best for you. Breast pumps can be purchased, but some hospitals and breastfeeding support groups lease breast pumps on a monthly basis. A lactation consultant can teach you how to   hand express breast milk, if you prefer not to use a pump. Caring for your breasts while you breastfeed Nipples can become dry, cracked, and sore while breastfeeding. The following recommendations can help keep your breasts moisturized and healthy:  Avoid using soap on your nipples.  Wear a supportive bra. Although not required, special nursing bras and tank tops are designed to allow access to your breasts for breastfeeding without taking off your entire bra or top. Avoid wearing underwire-style bras or extremely tight bras.  Air dry your nipples for 3-4minutes after each feeding.  Use only cotton bra pads to absorb leaked breast milk. Leaking of breast milk between feedings is normal.  Use lanolin on your nipples after breastfeeding. Lanolin helps to maintain your skin's normal moisture barrier. If you use pure lanolin, you do not need to wash it off  before feeding your baby again. Pure lanolin is not toxic to your baby. You may also hand express a few drops of breast milk and gently massage that milk into your nipples and allow the milk to air dry. In the first few weeks after giving birth, some women experience extremely full breasts (engorgement). Engorgement can make your breasts feel heavy, warm, and tender to the touch. Engorgement peaks within 3-5 days after you give birth. The following recommendations can help ease engorgement:  Completely empty your breasts while breastfeeding or pumping. You may want to start by applying warm, moist heat (in the shower or with warm water-soaked hand towels) just before feeding or pumping. This increases circulation and helps the milk flow. If your baby does not completely empty your breasts while breastfeeding, pump any extra milk after he or she is finished.  Wear a snug bra (nursing or regular) or tank top for 1-2 days to signal your body to slightly decrease milk production.  Apply ice packs to your breasts, unless this is too uncomfortable for you.  Make sure that your baby is latched on and positioned properly while breastfeeding. If engorgement persists after 48 hours of following these recommendations, contact your health care provider or a lactation consultant. Overall health care recommendations while breastfeeding  Eat healthy foods. Alternate between meals and snacks, eating 3 of each per day. Because what you eat affects your breast milk, some of the foods may make your baby more irritable than usual. Avoid eating these foods if you are sure that they are negatively affecting your baby.  Drink milk, fruit juice, and water to satisfy your thirst (about 10 glasses a day).  Rest often, relax, and continue to take your prenatal vitamins to prevent fatigue, stress, and anemia.  Continue breast self-awareness checks.  Avoid chewing and smoking tobacco. Chemicals from cigarettes that pass  into breast milk and exposure to secondhand smoke may harm your baby.  Avoid alcohol and drug use, including marijuana. Some medicines that may be harmful to your baby can pass through breast milk. It is important to ask your health care provider before taking any medicine, including all over-the-counter and prescription medicine as well as vitamin and herbal supplements. It is possible to become pregnant while breastfeeding. If birth control is desired, ask your health care provider about options that will be safe for your baby. Contact a health care provider if:  You feel like you want to stop breastfeeding or have become frustrated with breastfeeding.  You have painful breasts or nipples.  Your nipples are cracked or bleeding.  Your breasts are red, tender, or warm.  You have   a swollen area on either breast.  You have a fever or chills.  You have nausea or vomiting.  You have drainage other than breast milk from your nipples.  Your breasts do not become full before feedings by the fifth day after you give birth.  You feel sad and depressed.  Your baby is too sleepy to eat well.  Your baby is having trouble sleeping.  Your baby is wetting less than 3 diapers in a 24-hour period.  Your baby has less than 3 stools in a 24-hour period.  Your baby's skin or the white part of his or her eyes becomes yellow.  Your baby is not gaining weight by 5 days of age. Get help right away if:  Your baby is overly tired (lethargic) and does not want to wake up and feed.  Your baby develops an unexplained fever. This information is not intended to replace advice given to you by your health care provider. Make sure you discuss any questions you have with your health care provider. Document Released: 01/17/2005 Document Revised: 07/01/2015 Document Reviewed: 07/11/2012 Elsevier Interactive Patient Education  2017 Elsevier Inc.  
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
## 2016-02-28 ENCOUNTER — Encounter: Payer: Self-pay | Admitting: Family Medicine

## 2016-03-02 ENCOUNTER — Other Ambulatory Visit: Payer: Self-pay | Admitting: Obstetrics & Gynecology

## 2016-03-02 DIAGNOSIS — O99342 Other mental disorders complicating pregnancy, second trimester: Principal | ICD-10-CM

## 2016-03-02 DIAGNOSIS — F329 Major depressive disorder, single episode, unspecified: Secondary | ICD-10-CM

## 2016-03-02 DIAGNOSIS — F32A Depression, unspecified: Secondary | ICD-10-CM

## 2016-03-03 ENCOUNTER — Encounter (HOSPITAL_COMMUNITY): Payer: Self-pay

## 2016-03-03 ENCOUNTER — Ambulatory Visit (HOSPITAL_COMMUNITY)
Admission: RE | Admit: 2016-03-03 | Discharge: 2016-03-03 | Disposition: A | Discharge status: Home / Self Care | Payer: Medicaid Other | Source: Ambulatory Visit | Attending: Obstetrics and Gynecology | Admitting: Obstetrics and Gynecology

## 2016-03-03 ENCOUNTER — Encounter: Payer: Self-pay | Admitting: Obstetrics and Gynecology

## 2016-03-03 ENCOUNTER — Encounter (HOSPITAL_COMMUNITY)
Admission: AD | Disposition: A | Discharge status: Home / Self Care | Payer: Self-pay | Source: Ambulatory Visit | Attending: Obstetrics and Gynecology

## 2016-03-03 ENCOUNTER — Inpatient Hospital Stay (HOSPITAL_COMMUNITY): Payer: Medicaid Other | Admitting: Anesthesiology

## 2016-03-03 ENCOUNTER — Inpatient Hospital Stay (HOSPITAL_COMMUNITY)
Admission: AD | Admit: 2016-03-03 | Discharge: 2016-03-06 | DRG: 765 | Disposition: A | Discharge status: Home / Self Care | Payer: Medicaid Other | Source: Ambulatory Visit | Attending: Obstetrics and Gynecology | Admitting: Obstetrics and Gynecology

## 2016-03-03 ENCOUNTER — Encounter (HOSPITAL_COMMUNITY): Payer: Self-pay | Admitting: Anesthesiology

## 2016-03-03 DIAGNOSIS — K219 Gastro-esophageal reflux disease without esophagitis: Secondary | ICD-10-CM | POA: Diagnosis present

## 2016-03-03 DIAGNOSIS — O0990 Supervision of high risk pregnancy, unspecified, unspecified trimester: Secondary | ICD-10-CM

## 2016-03-03 DIAGNOSIS — O34219 Maternal care for unspecified type scar from previous cesarean delivery: Secondary | ICD-10-CM

## 2016-03-03 DIAGNOSIS — Z8249 Family history of ischemic heart disease and other diseases of the circulatory system: Secondary | ICD-10-CM | POA: Diagnosis not present

## 2016-03-03 DIAGNOSIS — O42913 Preterm premature rupture of membranes, unspecified as to length of time between rupture and onset of labor, third trimester: Secondary | ICD-10-CM | POA: Diagnosis present

## 2016-03-03 DIAGNOSIS — J45909 Unspecified asthma, uncomplicated: Secondary | ICD-10-CM | POA: Diagnosis present

## 2016-03-03 DIAGNOSIS — O42013 Preterm premature rupture of membranes, onset of labor within 24 hours of rupture, third trimester: Secondary | ICD-10-CM

## 2016-03-03 DIAGNOSIS — O09219 Supervision of pregnancy with history of pre-term labor, unspecified trimester: Secondary | ICD-10-CM

## 2016-03-03 DIAGNOSIS — Z98891 History of uterine scar from previous surgery: Secondary | ICD-10-CM

## 2016-03-03 DIAGNOSIS — O9952 Diseases of the respiratory system complicating childbirth: Secondary | ICD-10-CM | POA: Diagnosis present

## 2016-03-03 DIAGNOSIS — Z833 Family history of diabetes mellitus: Secondary | ICD-10-CM

## 2016-03-03 DIAGNOSIS — O9962 Diseases of the digestive system complicating childbirth: Secondary | ICD-10-CM | POA: Diagnosis present

## 2016-03-03 DIAGNOSIS — O24420 Gestational diabetes mellitus in childbirth, diet controlled: Secondary | ICD-10-CM | POA: Diagnosis present

## 2016-03-03 DIAGNOSIS — IMO0002 Reserved for concepts with insufficient information to code with codable children: Secondary | ICD-10-CM | POA: Diagnosis present

## 2016-03-03 DIAGNOSIS — Z3A35 35 weeks gestation of pregnancy: Secondary | ICD-10-CM

## 2016-03-03 DIAGNOSIS — O34211 Maternal care for low transverse scar from previous cesarean delivery: Principal | ICD-10-CM | POA: Diagnosis present

## 2016-03-03 DIAGNOSIS — Z7982 Long term (current) use of aspirin: Secondary | ICD-10-CM | POA: Diagnosis not present

## 2016-03-03 DIAGNOSIS — O09899 Supervision of other high risk pregnancies, unspecified trimester: Secondary | ICD-10-CM

## 2016-03-03 LAB — TYPE AND SCREEN
ABO/RH(D): B POS
Antibody Screen: NEGATIVE

## 2016-03-03 LAB — GLUCOSE, CAPILLARY
GLUCOSE-CAPILLARY: 80 mg/dL (ref 65–99)
Glucose-Capillary: 103 mg/dL — ABNORMAL HIGH (ref 65–99)

## 2016-03-03 LAB — CBC
HCT: 34.6 % — ABNORMAL LOW (ref 36.0–46.0)
Hemoglobin: 12.2 g/dL (ref 12.0–15.0)
MCH: 28.2 pg (ref 26.0–34.0)
MCHC: 35.3 g/dL (ref 30.0–36.0)
MCV: 80.1 fL (ref 78.0–100.0)
PLATELETS: 224 10*3/uL (ref 150–400)
RBC: 4.32 MIL/uL (ref 3.87–5.11)
RDW: 13.9 % (ref 11.5–15.5)
WBC: 11.5 10*3/uL — AB (ref 4.0–10.5)

## 2016-03-03 LAB — AMNISURE RUPTURE OF MEMBRANE (ROM) NOT AT ARMC: AMNISURE: POSITIVE

## 2016-03-03 LAB — ABO/RH: ABO/RH(D): B POS

## 2016-03-03 SURGERY — Surgical Case
Anesthesia: Spinal

## 2016-03-03 MED ORDER — DEXAMETHASONE SODIUM PHOSPHATE 4 MG/ML IJ SOLN
INTRAMUSCULAR | Status: DC | PRN
  Administered 2016-03-03: 4 mg via INTRAVENOUS

## 2016-03-03 MED ORDER — COCONUT OIL OIL: 1.0000 "application " | TOPICAL_OIL | Status: DC | PRN

## 2016-03-03 MED ORDER — TRIAMCINOLONE ACETONIDE 40 MG/ML IJ SUSP
40.0000 mg | Freq: Once | INTRAMUSCULAR | Status: AC
Start: 2016-03-03 — End: 2016-03-03
  Administered 2016-03-03: 40 mg via INTRADERMAL
  Filled 2016-03-03: qty 1

## 2016-03-03 MED ORDER — NALBUPHINE HCL 10 MG/ML IJ SOLN
5.0000 mg | Freq: Once | INTRAMUSCULAR | Status: AC | PRN
  Administered 2016-03-03: 5 mg via INTRAVENOUS

## 2016-03-03 MED ORDER — SCOPOLAMINE 1 MG/3DAYS TD PT72: 1.0000 | MEDICATED_PATCH | Freq: Once | TRANSDERMAL | Status: DC

## 2016-03-03 MED ORDER — NALOXONE HCL 0.4 MG/ML IJ SOLN: 0.4000 mg | INTRAMUSCULAR | Status: DC | PRN

## 2016-03-03 MED ORDER — OXYCODONE HCL 5 MG PO TABS: 5.0000 mg | ORAL_TABLET | Freq: Once | ORAL | Status: DC | PRN

## 2016-03-03 MED ORDER — SIMETHICONE 80 MG PO CHEW
80.0000 mg | CHEWABLE_TABLET | ORAL | Status: DC
  Administered 2016-03-03 – 2016-03-06 (×3): 80 mg via ORAL
  Filled 2016-03-03 (×4): qty 1

## 2016-03-03 MED ORDER — OXYTOCIN 10 UNIT/ML IJ SOLN
INTRAVENOUS | Status: DC | PRN
  Administered 2016-03-03: 40 [IU] via INTRAVENOUS

## 2016-03-03 MED ORDER — TETANUS-DIPHTH-ACELL PERTUSSIS 5-2.5-18.5 LF-MCG/0.5 IM SUSP
0.5000 mL | Freq: Once | INTRAMUSCULAR | Status: AC
  Administered 2016-03-05: 0.5 mL via INTRAMUSCULAR
  Filled 2016-03-03: qty 0.5

## 2016-03-03 MED ORDER — DIPHENHYDRAMINE HCL 50 MG/ML IJ SOLN: 12.5000 mg | INTRAMUSCULAR | Status: DC | PRN

## 2016-03-03 MED ORDER — SIMETHICONE 80 MG PO CHEW
80.0000 mg | CHEWABLE_TABLET | Freq: Three times a day (TID) | ORAL | Status: DC
  Administered 2016-03-04 – 2016-03-06 (×8): 80 mg via ORAL
  Filled 2016-03-03 (×6): qty 1

## 2016-03-03 MED ORDER — OXYCODONE HCL 5 MG/5ML PO SOLN: 5.0000 mg | Freq: Once | ORAL | Status: DC | PRN

## 2016-03-03 MED ORDER — WITCH HAZEL-GLYCERIN EX PADS: 1.0000 "application " | MEDICATED_PAD | CUTANEOUS | Status: DC | PRN

## 2016-03-03 MED ORDER — ALBUTEROL SULFATE (2.5 MG/3ML) 0.083% IN NEBU: 3.0000 mL | INHALATION_SOLUTION | RESPIRATORY_TRACT | Status: DC | PRN

## 2016-03-03 MED ORDER — LACTATED RINGERS IV SOLN
INTRAVENOUS | Status: DC
  Administered 2016-03-03 (×3): via INTRAVENOUS

## 2016-03-03 MED ORDER — BUPIVACAINE HCL (PF) 0.25 % IJ SOLN
INTRAMUSCULAR | Status: AC
  Filled 2016-03-03: qty 10

## 2016-03-03 MED ORDER — PHENYLEPHRINE 8 MG IN D5W 100 ML (0.08MG/ML) PREMIX OPTIME
INJECTION | INTRAVENOUS | Status: AC
  Filled 2016-03-03: qty 100

## 2016-03-03 MED ORDER — CEFAZOLIN SODIUM-DEXTROSE 2-4 GM/100ML-% IV SOLN
2.0000 g | INTRAVENOUS | Status: AC
  Administered 2016-03-03: 2 g via INTRAVENOUS
  Filled 2016-03-03: qty 100

## 2016-03-03 MED ORDER — KETOROLAC TROMETHAMINE 30 MG/ML IJ SOLN: 30.0000 mg | Freq: Four times a day (QID) | INTRAMUSCULAR | Status: DC | PRN

## 2016-03-03 MED ORDER — ZOLPIDEM TARTRATE 5 MG PO TABS
5.0000 mg | ORAL_TABLET | Freq: Every evening | ORAL | Status: DC | PRN
  Administered 2016-03-05 – 2016-03-06 (×2): 5 mg via ORAL
  Filled 2016-03-03 (×2): qty 1

## 2016-03-03 MED ORDER — OXYTOCIN 40 UNITS IN LACTATED RINGERS INFUSION - SIMPLE MED: 2.5000 [IU]/h | INTRAVENOUS | Status: AC

## 2016-03-03 MED ORDER — DEXAMETHASONE SODIUM PHOSPHATE 4 MG/ML IJ SOLN
INTRAMUSCULAR | Status: AC
  Filled 2016-03-03: qty 1

## 2016-03-03 MED ORDER — BUPIVACAINE IN DEXTROSE 0.75-8.25 % IT SOLN
INTRATHECAL | Status: DC | PRN
Start: 2016-03-03 — End: 2016-03-03
  Administered 2016-03-03: 1.6 mL via INTRATHECAL

## 2016-03-03 MED ORDER — SENNOSIDES-DOCUSATE SODIUM 8.6-50 MG PO TABS
2.0000 | ORAL_TABLET | ORAL | Status: DC
  Administered 2016-03-03 – 2016-03-06 (×3): 2 via ORAL
  Filled 2016-03-03 (×4): qty 2

## 2016-03-03 MED ORDER — KETOROLAC TROMETHAMINE 30 MG/ML IJ SOLN
30.0000 mg | Freq: Four times a day (QID) | INTRAMUSCULAR | Status: DC | PRN
  Administered 2016-03-03: 30 mg via INTRAMUSCULAR

## 2016-03-03 MED ORDER — PHENYLEPHRINE 8 MG IN D5W 100 ML (0.08MG/ML) PREMIX OPTIME
INJECTION | INTRAVENOUS | Status: DC | PRN
  Administered 2016-03-03: 60 ug/min via INTRAVENOUS

## 2016-03-03 MED ORDER — SODIUM CHLORIDE 0.9 % IR SOLN
Status: DC | PRN
  Administered 2016-03-03: 1

## 2016-03-03 MED ORDER — SERTRALINE HCL 100 MG PO TABS
100.0000 mg | ORAL_TABLET | Freq: Every day | ORAL | Status: DC
  Administered 2016-03-04 – 2016-03-06 (×3): 100 mg via ORAL
  Filled 2016-03-03 (×4): qty 1

## 2016-03-03 MED ORDER — ACETAMINOPHEN 325 MG PO TABS: 650.0000 mg | ORAL_TABLET | ORAL | Status: DC | PRN

## 2016-03-03 MED ORDER — ONDANSETRON HCL 4 MG/2ML IJ SOLN: 4.0000 mg | Freq: Three times a day (TID) | INTRAMUSCULAR | Status: DC | PRN

## 2016-03-03 MED ORDER — NALBUPHINE HCL 10 MG/ML IJ SOLN: 5.0000 mg | INTRAMUSCULAR | Status: DC | PRN

## 2016-03-03 MED ORDER — DIPHENHYDRAMINE HCL 25 MG PO CAPS: 25.0000 mg | ORAL_CAPSULE | ORAL | Status: DC | PRN

## 2016-03-03 MED ORDER — FENTANYL CITRATE (PF) 100 MCG/2ML IJ SOLN: 25.0000 ug | INTRAMUSCULAR | Status: DC | PRN

## 2016-03-03 MED ORDER — FENTANYL CITRATE (PF) 100 MCG/2ML IJ SOLN
INTRAMUSCULAR | Status: DC | PRN
  Administered 2016-03-03: 10 ug via INTRATHECAL

## 2016-03-03 MED ORDER — MEPERIDINE HCL 25 MG/ML IJ SOLN: 6.2500 mg | INTRAMUSCULAR | Status: DC | PRN

## 2016-03-03 MED ORDER — KETOROLAC TROMETHAMINE 30 MG/ML IJ SOLN
INTRAMUSCULAR | Status: AC
  Filled 2016-03-03: qty 1

## 2016-03-03 MED ORDER — DIPHENHYDRAMINE HCL 25 MG PO CAPS
25.0000 mg | ORAL_CAPSULE | Freq: Four times a day (QID) | ORAL | Status: DC | PRN
  Administered 2016-03-03 – 2016-03-04 (×2): 25 mg via ORAL
  Filled 2016-03-03 (×2): qty 1

## 2016-03-03 MED ORDER — SCOPOLAMINE 1 MG/3DAYS TD PT72
MEDICATED_PATCH | TRANSDERMAL | Status: AC
  Filled 2016-03-03: qty 1

## 2016-03-03 MED ORDER — ONDANSETRON HCL 4 MG/2ML IJ SOLN
INTRAMUSCULAR | Status: DC | PRN
  Administered 2016-03-03: 4 mg via INTRAVENOUS

## 2016-03-03 MED ORDER — PHENYLEPHRINE HCL 10 MG/ML IJ SOLN
INTRAMUSCULAR | Status: DC | PRN
  Administered 2016-03-03 (×5): 80 ug via INTRAVENOUS

## 2016-03-03 MED ORDER — FENTANYL CITRATE (PF) 100 MCG/2ML IJ SOLN
INTRAMUSCULAR | Status: AC
  Filled 2016-03-03: qty 2

## 2016-03-03 MED ORDER — PRENATAL MULTIVITAMIN CH
1.0000 | ORAL_TABLET | Freq: Every day | ORAL | Status: DC
  Administered 2016-03-04 – 2016-03-06 (×3): 1 via ORAL
  Filled 2016-03-03 (×3): qty 1

## 2016-03-03 MED ORDER — SIMETHICONE 80 MG PO CHEW: 80.0000 mg | CHEWABLE_TABLET | ORAL | Status: DC | PRN

## 2016-03-03 MED ORDER — OXYCODONE-ACETAMINOPHEN 5-325 MG PO TABS: 2.0000 | ORAL_TABLET | ORAL | Status: DC | PRN

## 2016-03-03 MED ORDER — MORPHINE SULFATE (PF) 0.5 MG/ML IJ SOLN
INTRAMUSCULAR | Status: AC
  Filled 2016-03-03: qty 10

## 2016-03-03 MED ORDER — DIPHENHYDRAMINE HCL 50 MG/ML IJ SOLN
INTRAMUSCULAR | Status: DC | PRN
  Administered 2016-03-03: 25 mg via INTRAVENOUS

## 2016-03-03 MED ORDER — ONDANSETRON HCL 4 MG/2ML IJ SOLN: 4.0000 mg | Freq: Four times a day (QID) | INTRAMUSCULAR | Status: DC | PRN

## 2016-03-03 MED ORDER — IBUPROFEN 600 MG PO TABS
600.0000 mg | ORAL_TABLET | Freq: Four times a day (QID) | ORAL | Status: DC
  Administered 2016-03-04 – 2016-03-06 (×11): 600 mg via ORAL
  Filled 2016-03-03 (×12): qty 1

## 2016-03-03 MED ORDER — SODIUM CHLORIDE 0.9% FLUSH: 3.0000 mL | INTRAVENOUS | Status: DC | PRN

## 2016-03-03 MED ORDER — NALOXONE HCL 2 MG/2ML IJ SOSY: 1.0000 ug/kg/h | PREFILLED_SYRINGE | INTRAVENOUS | Status: DC | PRN

## 2016-03-03 MED ORDER — TRIAMCINOLONE ACETONIDE 40 MG/ML IJ SUSP
INTRAMUSCULAR | Status: DC | PRN
  Administered 2016-03-03: 40 mg via INTRAMUSCULAR

## 2016-03-03 MED ORDER — DIBUCAINE 1 % RE OINT: 1.0000 "application " | TOPICAL_OINTMENT | RECTAL | Status: DC | PRN

## 2016-03-03 MED ORDER — SOD CITRATE-CITRIC ACID 500-334 MG/5ML PO SOLN
30.0000 mL | ORAL | Status: AC
  Administered 2016-03-03: 30 mL via ORAL
  Filled 2016-03-03: qty 15

## 2016-03-03 MED ORDER — BETAMETHASONE SOD PHOS & ACET 6 (3-3) MG/ML IJ SUSP
12.0000 mg | Freq: Once | INTRAMUSCULAR | Status: AC
  Administered 2016-03-03: 12 mg via INTRAMUSCULAR
  Filled 2016-03-03: qty 2

## 2016-03-03 MED ORDER — PHENYLEPHRINE 40 MCG/ML (10ML) SYRINGE FOR IV PUSH (FOR BLOOD PRESSURE SUPPORT)
PREFILLED_SYRINGE | INTRAVENOUS | Status: AC
  Filled 2016-03-03: qty 10

## 2016-03-03 MED ORDER — NALBUPHINE HCL 10 MG/ML IJ SOLN: 5.0000 mg | Freq: Once | INTRAMUSCULAR | Status: AC | PRN

## 2016-03-03 MED ORDER — SCOPOLAMINE 1 MG/3DAYS TD PT72
MEDICATED_PATCH | TRANSDERMAL | Status: DC | PRN
  Administered 2016-03-03: 1 via TRANSDERMAL

## 2016-03-03 MED ORDER — MORPHINE SULFATE (PF) 0.5 MG/ML IJ SOLN
INTRAMUSCULAR | Status: DC | PRN
  Administered 2016-03-03: .2 mg via INTRATHECAL

## 2016-03-03 MED ORDER — OXYTOCIN 10 UNIT/ML IJ SOLN
INTRAMUSCULAR | Status: AC
  Filled 2016-03-03: qty 4

## 2016-03-03 MED ORDER — OXYCODONE-ACETAMINOPHEN 5-325 MG PO TABS: 1.0000 | ORAL_TABLET | ORAL | Status: DC | PRN

## 2016-03-03 MED ORDER — FAMOTIDINE IN NACL 20-0.9 MG/50ML-% IV SOLN
20.0000 mg | Freq: Once | INTRAVENOUS | Status: AC
  Administered 2016-03-03: 20 mg via INTRAVENOUS
  Filled 2016-03-03: qty 50

## 2016-03-03 MED ORDER — BUPIVACAINE HCL (PF) 0.25 % IJ SOLN
INTRAMUSCULAR | Status: DC | PRN
  Administered 2016-03-03: 10 mL

## 2016-03-03 MED ORDER — ONDANSETRON HCL 4 MG/2ML IJ SOLN
INTRAMUSCULAR | Status: AC
  Filled 2016-03-03: qty 2

## 2016-03-03 MED ORDER — MENTHOL 3 MG MT LOZG: 1.0000 | LOZENGE | OROMUCOSAL | Status: DC | PRN

## 2016-03-03 MED ORDER — DIPHENHYDRAMINE HCL 50 MG/ML IJ SOLN
INTRAMUSCULAR | Status: AC
  Filled 2016-03-03: qty 1

## 2016-03-03 MED ORDER — NALBUPHINE HCL 10 MG/ML IJ SOLN
INTRAMUSCULAR | Status: AC
  Filled 2016-03-03: qty 1

## 2016-03-03 MED ORDER — LACTATED RINGERS IV SOLN: INTRAVENOUS | Status: DC

## 2016-03-03 SURGICAL SUPPLY — 47 items
BENZOIN TINCTURE PRP APPL 2/3 (GAUZE/BANDAGES/DRESSINGS) ×3 IMPLANT
CHLORAPREP W/TINT 26ML (MISCELLANEOUS) ×3 IMPLANT
CLAMP CORD UMBIL (MISCELLANEOUS) IMPLANT
CLOSURE WOUND 1/2 X4 (GAUZE/BANDAGES/DRESSINGS) ×1
CLOSURE WOUND 1/4X4 (GAUZE/BANDAGES/DRESSINGS) ×1
CLOTH BEACON ORANGE TIMEOUT ST (SAFETY) ×3 IMPLANT
DRAPE C SECTION CLR SCREEN (DRAPES) ×3 IMPLANT
DRSG OPSITE POSTOP 4X10 (GAUZE/BANDAGES/DRESSINGS) ×3 IMPLANT
ELECT REM PT RETURN 9FT ADLT (ELECTROSURGICAL) ×3
ELECTRODE REM PT RTRN 9FT ADLT (ELECTROSURGICAL) ×1 IMPLANT
EXTRACTOR VACUUM M CUP 4 TUBE (SUCTIONS) IMPLANT
EXTRACTOR VACUUM M CUP 4' TUBE (SUCTIONS)
GLOVE BIO SURGEON STRL SZ7.5 (GLOVE) ×3 IMPLANT
GLOVE BIOGEL PI IND STRL 7.0 (GLOVE) ×1 IMPLANT
GLOVE BIOGEL PI INDICATOR 7.0 (GLOVE) ×2
GOWN STRL REUS W/TWL 2XL LVL3 (GOWN DISPOSABLE) ×3 IMPLANT
GOWN STRL REUS W/TWL LRG LVL3 (GOWN DISPOSABLE) ×6 IMPLANT
HEMOSTAT ARISTA ABSORB 3G PWDR (MISCELLANEOUS) ×3 IMPLANT
KIT ABG SYR 3ML LUER SLIP (SYRINGE) IMPLANT
NEEDLE HYPO 22GX1.5 SAFETY (NEEDLE) ×3 IMPLANT
NEEDLE HYPO 25X5/8 SAFETYGLIDE (NEEDLE) IMPLANT
NS IRRIG 1000ML POUR BTL (IV SOLUTION) ×3 IMPLANT
PACK C SECTION WH (CUSTOM PROCEDURE TRAY) ×3 IMPLANT
PAD ABD 7.5X8 STRL (GAUZE/BANDAGES/DRESSINGS) ×3 IMPLANT
PAD OB MATERNITY 4.3X12.25 (PERSONAL CARE ITEMS) ×3 IMPLANT
PENCIL SMOKE EVAC W/HOLSTER (ELECTROSURGICAL) ×3 IMPLANT
RTRCTR C-SECT PINK 25CM LRG (MISCELLANEOUS) ×3 IMPLANT
SEPRAFILM MEMBRANE 5X6 (MISCELLANEOUS) ×3 IMPLANT
SPONGE GAUZE 4X4 12PLY STER LF (GAUZE/BANDAGES/DRESSINGS) ×6 IMPLANT
STRIP CLOSURE SKIN 1/2X4 (GAUZE/BANDAGES/DRESSINGS) ×2 IMPLANT
STRIP CLOSURE SKIN 1/4X4 (GAUZE/BANDAGES/DRESSINGS) ×2 IMPLANT
SUT CHROMIC 0 CTX 36 (SUTURE) ×3 IMPLANT
SUT CHROMIC 1 CTX 36 (SUTURE) ×9 IMPLANT
SUT VIC AB 1 CT1 27 (SUTURE) ×6
SUT VIC AB 1 CT1 27XBRD ANTBC (SUTURE) ×3 IMPLANT
SUT VIC AB 2-0 CT1 (SUTURE) ×6 IMPLANT
SUT VIC AB 2-0 CT1 27 (SUTURE) ×2
SUT VIC AB 2-0 CT1 TAPERPNT 27 (SUTURE) ×1 IMPLANT
SUT VIC AB 3-0 CT1 27 (SUTURE) ×6
SUT VIC AB 3-0 CT1 TAPERPNT 27 (SUTURE) ×3 IMPLANT
SUT VIC AB 3-0 SH 27 (SUTURE)
SUT VIC AB 3-0 SH 27X BRD (SUTURE) IMPLANT
SUT VIC AB 4-0 KS 27 (SUTURE) ×3 IMPLANT
SYR BULB IRRIGATION 50ML (SYRINGE) ×6 IMPLANT
SYR CONTROL 10ML LL (SYRINGE) ×3 IMPLANT
TOWEL OR 17X24 6PK STRL BLUE (TOWEL DISPOSABLE) ×3 IMPLANT
TRAY FOLEY CATH SILVER 14FR (SET/KITS/TRAYS/PACK) ×3 IMPLANT

## 2016-03-03 NOTE — MAU Note (Signed)
Urine sent to lab @0907 

## 2016-03-03 NOTE — Lactation Note (Signed)
This note was copied from a baby's chart. Lactation Consultation Note  Patient Name: Sarah Contreras ZOXWR'UToday's Date: 03/03/2016 Reason for consult: Follow-up assessment Baby at 3 hr of life, in CN under [place]xy[place]. Set mom up with DEBP in her room. Given more bullets for milk storage. Discussed LPT baby behavior, feeding frequency, pumping, supplementing, baby belly size, voids, wt loss, breast changes, and nipple care. Demonstrated manual expression, colostrum noted bilaterally. Given lactation and LPT infant handouts. Aware of OP services and support group.     Maternal Data Formula Feeding for Exclusion: No Has patient been taught Hand Expression?: No Does the patient have breastfeeding experience prior to this delivery?: Yes  Feeding Feeding Type: Breast Milk  LATCH Score/Interventions                      Lactation Tools Discussed/Used WIC Program: Yes Pump Review: Setup, frequency, and cleaning;Milk Storage;Other (comment) (pump settings) Initiated by:: ES Date initiated:: 03/03/16   Consult Status Consult Status: Follow-up Date: 03/04/16 Follow-up type: In-patient    Rulon Eisenmengerlizabeth E Samentha Perham 03/03/2016, 7:18 PM

## 2016-03-03 NOTE — Op Note (Signed)
Cesarean Section Procedure Note  03/03/2016  5:23 PM  PATIENT:  Sarah Contreras  32 y.o. female  PRE-OPERATIVE DIAGNOSIS:  repeat cesarean  POST-OPERATIVE DIAGNOSIS:  repeat cesarean  PROCEDURE:  Procedure(s): CESAREAN SECTION (N/A)  SURGEON:  Surgeon(s) and Role:    * Hermina Staggers, MD - Primary    * Lorne Skeens, MD - Fellow  ASSISTANTS: none   ANESTHESIA:   spinal  EBL:  Total I/O In: 3200 [I.V.:3200] Out: 1300 [Urine:150; Blood:1150]  BLOOD ADMINISTERED:none  DRAINS: none   LOCAL MEDICATIONS USED:  Kenaolog with 0.25% marcaine  SPECIMEN:  No Specimen  DISPOSITION OF SPECIMEN:  N/A   Procedure Details   The patient was seen in the Holding Room. The risks, benefits, complications, treatment options, and expected outcomes were discussed with the patient.  The patient concurred with the proposed plan, giving informed consent.  The site of surgery properly noted/marked. The patient was taken to Operating Room # 9, identified as Sarah Contreras and the procedure verified as C-Section Delivery. A Time Out was held and the above information confirmed.  After induction of anesthesia, the patient was draped and prepped in the usual sterile manner. A Pfannenstiel incision was made and carried down through the subcutaneous tissue to the fascia. Fascial incision was made and extended transversely. The fascia was separated from the underlying rectus tissue superiorly and inferiorly. The peritoneum was identified and entered. Bovie was used to open the rectus muscles along the scar tissue. The fascia incision was then extended. Adhesions from the anterior aspect of the uterus where taken down. Peritoneal incision was extended longitudinally. The utero-vesical peritoneal reflection was incised transversely and the bladder flap was bluntly freed from the lower uterine segment. A low transverse uterine incision was made. Delivered from cephalic presentation was  Female with Apgar  scores of 8 at one minute and 9 at five minutes. After the umbilical cord was clamped and cut cord blood was obtained for evaluation. The placenta was removed intact and appeared normal. The uterine outline, tubes and ovaries appeared normal. The uterine incision was closed with running locked sutures of Chromic. An imbricating layer of the same was placed. 2 adhesions from the anterior aspect of the uterus to the RLQ were clamped x2 cut and tied with chromic ties. A defect in the serosa and outer muscle layers near these adhesions was closed with 0-chromic. Several figure of eight stiches where placed around the hysterotomy site to obtain hemostasis. Several figure of eight stiches where placed in the rectus to obtain hemostasis.  The fascia was then reapproximated with running sutures of Vicryl Rapide.  The subcutaneous layer was closed with running 3-0 Vicryl. The skin was reapproximated with 3.0 and Vicryl.  Instrument, sponge, and needle counts were correct prior the abdominal closure and at the conclusion of the case.   Complications:  None; patient tolerated the procedure well.  COUNTS:  YES  PLAN OF CARE: Admit to inpatient   PATIENT DISPOSITION:  PACU - hemodynamically stable.   Delay start of Pharmacological VTE agent (>24hrs) due to surgical blood loss or risk of bleeding: yes             Disposition: PACU - hemodynamically stable.         Condition: stable   Lorne Skeens, MD 03/03/2016 5:23 PM  OB Attending Adhesions involved anterior aspect of uterus and abdominal wall. Released and ties as noted above. Seprafilm placed over anterior of uterus. Remainder of case  as dictated above  Nettie ElmMichael Sterling Ucci, MD

## 2016-03-03 NOTE — Anesthesia Procedure Notes (Signed)
Spinal  Patient location during procedure: OR Start time: 03/03/2016 3:31 PM End time: 03/03/2016 3:35 PM Staffing Anesthesiologist: Chaney MallingHODIERNE, Lurene Robley Performed: anesthesiologist  Preanesthetic Checklist Completed: patient identified, surgical consent, pre-op evaluation, timeout performed, IV checked, risks and benefits discussed and monitors and equipment checked Spinal Block Patient position: sitting Prep: DuraPrep Patient monitoring: cardiac monitor, continuous pulse ox and blood pressure Approach: midline Location: L3-4 Injection technique: single-shot Needle Needle type: Pencan  Needle gauge: 24 G Needle length: 9 cm Assessment Sensory level: T10 Additional Notes Functioning IV was confirmed and monitors were applied. Sterile prep and drape, including hand hygiene and sterile gloves were used. The patient was positioned and the spine was prepped. The skin was anesthetized with lidocaine.  Free flow of clear CSF was obtained prior to injecting local anesthetic into the CSF.  The spinal needle aspirated freely following injection.  The needle was carefully withdrawn.  The patient tolerated the procedure well.

## 2016-03-03 NOTE — Lactation Note (Signed)
This note was copied from a baby's chart. Lactation Consultation Note  Patient Name: Sarah Mertie Mooresbony Dorado RUEAV'WToday's Date: 03/03/2016 Reason for consult: Initial assessment;Late preterm infant   Initial consult with mom of 1 hour old infant in PACU. Infant in CN under OH. Mom reports she pumped for 1 month for her first child born at 3227 weeks that was in NICU for 2.5 months.   Discussed with mom that infant is a LPT infant. LPT infant handout given and explained. Discussed with mom that she is encouraged to pump every 3 hours to encourage her milk to come in and to use as supplement for LPT infant. Discussed with mom that if infant goes to NICU, a LC will come by and explain pumping and breast milk storage for the NICU infant.   PACU RN Melissa set up DEBP and had mom pump with DEBP, I followed pumping by hand expression and obtained 2 cc colostrum, colostrum taken to nursery.   BF Resources Handout and LC Brochure given, mom informed of IP/OP Services, BF Support Groups and LC phone #. [facility] and knows to call and make appt post d/c. [facility] shared with mom.   Will need to follow up later today to set up pump in PPU room for mom. Mom without further questions/concerns at this time.      Maternal Data Formula Feeding for Exclusion: No Has patient been taught Hand Expression?: No Does the patient have breastfeeding experience prior to this delivery?: Yes  Feeding    LATCH Score/Interventions                      Lactation Tools Discussed/Used WIC Program: Yes   Consult Status Consult Status: Follow-up Date: 03/03/16 Follow-up type: In-patient    Sarah Contreras 03/03/2016, 6:10 PM

## 2016-03-03 NOTE — Anesthesia Preprocedure Evaluation (Signed)
Anesthesia Evaluation  Patient identified by MRN, date of birth, ID band Patient awake    Reviewed: Allergy & Precautions, H&P , NPO status , Patient's Chart, lab work & pertinent test results  Airway Mallampati: II   Neck ROM: full    Dental   Pulmonary asthma ,    breath sounds clear to auscultation       Cardiovascular negative cardio ROS   Rhythm:regular Rate:Normal     Neuro/Psych  Headaches, PSYCHIATRIC DISORDERS Anxiety Depression    GI/Hepatic GERD  ,  Endo/Other  diabetes, Gestationalobese  Renal/GU      Musculoskeletal   Abdominal   Peds  Hematology   Anesthesia Other Findings   Reproductive/Obstetrics (+) Pregnancy                            Anesthesia Physical Anesthesia Plan  ASA: II  Anesthesia Plan: Spinal   Post-op Pain Management:    Induction: Intravenous  Airway Management Planned: Simple Face Mask  Additional Equipment:   Intra-op Plan:   Post-operative Plan:   Informed Consent: I have reviewed the patients History and Physical, chart, labs and discussed the procedure including the risks, benefits and alternatives for the proposed anesthesia with the patient or authorized representative who has indicated his/her understanding and acceptance.     Plan Discussed with: CRNA, Anesthesiologist and Surgeon  Anesthesia Plan Comments:        Anesthesia Quick Evaluation

## 2016-03-03 NOTE — Anesthesia Preprocedure Evaluation (Deleted)
Anesthesia Evaluation  Patient identified by MRN, date of birth, ID band Patient awake    Reviewed: Allergy & Precautions, H&P , NPO status , Patient's Chart, lab work & pertinent test results  Airway Mallampati: II   Neck ROM: full    Dental   Pulmonary asthma ,    breath sounds clear to auscultation       Cardiovascular negative cardio ROS   Rhythm:regular Rate:Normal     Neuro/Psych  Headaches, PSYCHIATRIC DISORDERS Anxiety Depression    GI/Hepatic GERD  ,  Endo/Other  diabetes, Gestationalobese  Renal/GU      Musculoskeletal   Abdominal   Peds  Hematology   Anesthesia Other Findings   Reproductive/Obstetrics                             Anesthesia Physical Anesthesia Plan  ASA: II  Anesthesia Plan: Spinal   Post-op Pain Management:    Induction: Intravenous  Airway Management Planned: Simple Face Mask  Additional Equipment:   Intra-op Plan:   Post-operative Plan:   Informed Consent: I have reviewed the patients History and Physical, chart, labs and discussed the procedure including the risks, benefits and alternatives for the proposed anesthesia with the patient or authorized representative who has indicated his/her understanding and acceptance.     Plan Discussed with: CRNA, Anesthesiologist and Surgeon  Anesthesia Plan Comments:         Anesthesia Quick Evaluation

## 2016-03-03 NOTE — Anesthesia Postprocedure Evaluation (Addendum)
Anesthesia Post Note  Patient: Sarah Contreras  Procedure(s) Performed: Procedure(s) (LRB): CESAREAN SECTION (N/A)  Patient location during evaluation: Mother Baby Anesthesia Type: Spinal Level of consciousness: awake and alert and oriented Pain management: satisfactory to patient Vital Signs Assessment: post-procedure vital signs reviewed and stable Respiratory status: respiratory function stable and spontaneous breathing Cardiovascular status: blood pressure returned to baseline Postop Assessment: no headache, no backache, spinal receding, patient able to bend at knees and adequate PO intake Anesthetic complications: no        Last Vitals:  Vitals:   03/03/16 1914 03/03/16 2014  BP: 119/80 115/75  Pulse: (!) 104 (!) 102  Resp: 18 16  Temp: 37.4 C 36.5 C    Last Pain:  Vitals:   03/03/16 2014  TempSrc: Oral  PainSc: 0-No pain   Pain Goal:                 FUSSELL,WYNN
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
## 2016-03-03 NOTE — MAU Note (Signed)
Pt presents to MAU with ROM and decreased fetal movement. Pt reports around 0300 she felt a "gush" of fluid and that was the last time she felt baby move. Denies ctx and bleeding.

## 2016-03-03 NOTE — Transfer of Care (Signed)
Immediate Anesthesia Transfer of Care Note  Patient: Sarah Contreras  Procedure(s) Performed: Procedure(s): CESAREAN SECTION (N/A)  Patient Location: PACU  Anesthesia Type:Spinal  Level of Consciousness: awake, alert  and oriented  Airway & Oxygen Therapy: Patient Spontanous Breathing  Post-op Assessment: Report given to RN and Post -op Vital signs reviewed and stable  Post vital signs: Reviewed and stable  Last Vitals:  Vitals:   03/03/16 0914 03/03/16 1223  BP: 129/74 132/75  Pulse: 104 98  Resp: 18 18  Temp: 36.4 C 36.7 C    Last Pain:  Vitals:   03/03/16 1224  TempSrc:   PainSc: 0-No pain         Complications: No apparent anesthesia complications

## 2016-03-04 LAB — CBC
HEMATOCRIT: 26.2 % — AB (ref 36.0–46.0)
HEMOGLOBIN: 9.2 g/dL — AB (ref 12.0–15.0)
MCH: 28.7 pg (ref 26.0–34.0)
MCHC: 35.9 g/dL (ref 30.0–36.0)
MCV: 80.1 fL (ref 78.0–100.0)
Platelets: 190 10*3/uL (ref 150–400)
RBC: 3.27 MIL/uL — ABNORMAL LOW (ref 3.87–5.11)
RDW: 13.7 % (ref 11.5–15.5)
WBC: 16.6 10*3/uL — ABNORMAL HIGH (ref 4.0–10.5)

## 2016-03-04 MED ORDER — NALBUPHINE HCL 10 MG/ML IJ SOLN: 5.0000 mg | Freq: Once | INTRAMUSCULAR | Status: AC | PRN

## 2016-03-04 MED ORDER — HYDROCODONE-ACETAMINOPHEN 5-325 MG PO TABS
1.0000 | ORAL_TABLET | ORAL | Status: DC | PRN
  Administered 2016-03-04 (×4): 1 via ORAL
  Filled 2016-03-04 (×4): qty 1

## 2016-03-04 MED ORDER — HYDROCODONE-ACETAMINOPHEN 5-325 MG PO TABS
1.0000 | ORAL_TABLET | ORAL | Status: DC | PRN
  Administered 2016-03-04: 1 via ORAL
  Administered 2016-03-05 – 2016-03-06 (×8): 2 via ORAL
  Filled 2016-03-04 (×5): qty 2
  Filled 2016-03-04: qty 1
  Filled 2016-03-04 (×3): qty 2

## 2016-03-04 MED ORDER — NALBUPHINE HCL 10 MG/ML IJ SOLN
5.0000 mg | Freq: Once | INTRAMUSCULAR | Status: AC | PRN
  Administered 2016-03-04: 5 mg via SUBCUTANEOUS
  Filled 2016-03-04: qty 1

## 2016-03-04 NOTE — Progress Notes (Signed)
Subjective: Postpartum Day #1: Cesarean Delivery Patient reports tolerating PO and no problems voiding. Pumping and taking colostrum to NICU infant; no need for contraception due to same sex relationship; infant in NICU initially for respiratory issues, but now for blood sugar control; pt ambulating to NICU- denies dizziness or s/s anemia  Objective: Vital signs in last 24 hours: Temp:  [97.7 F (36.5 C)-98.9 F (37.2 C)] 98 F (36.7 C) (02/02 1825) Pulse Rate:  [77-102] 92 (02/02 1825) Resp:  [16-20] 18 (02/02 1825) BP: (104-125)/(60-86) 120/64 (02/02 1825) SpO2:  [96 %-100 %] 100 % (02/02 1423)  Physical Exam:  General: alert, cooperative and no distress Lochia: appropriate Uterine Fundus: firm Incision: honeycomb intact; stained approx 50% DVT Evaluation: No evidence of DVT seen on physical exam.   Recent Labs  03/03/16 1029 03/04/16 0647  HGB 12.2 9.2*  HCT 34.6* 26.2*    Assessment/Plan: Status post Cesarean section. Doing well postoperatively.  Continue current care. Anticipate d/c on 03/06/16 given NICU infant.  Cam HaiSHAW, KIMBERLY CNM 03/04/2016, 7:42 PM

## 2016-03-04 NOTE — Lactation Note (Signed)
This note was copied from a baby's chart. Lactation Consultation Note  Patient Name: Sarah Contreras [id number] Date: 03/04/2016 Reason for consult: Follow-up assessment  NICU baby 60 hours old. Mom reports that she is pumping every 3 hours but is not seeing anything. Mom states that she had lots of milk with first child. Assisted mom with hand expression with colostrum flowing bilaterally. Enc mom to pump every 2-3 hours followed by hand expression. Discussed EBM storage guidelines, including labeling and enc mom to take/send EBM to NICU for baby. Mom given [facility] brochure and NICU booklet with review. Discussed WIC loaner pump with mom and the benefits of hospital-grade pump while separated from baby. Mom aware of pumping rooms in the NICU and the need to take her kit--including paddles--with her at D/C.   Maternal Data    Feeding    LATCH Score/Interventions                      Lactation Tools Discussed/Used     Consult Status Consult Status: Follow-up Date: 03/05/16 Follow-up type: In-patient    Andres Labrum 03/04/2016, 9:48 AM

## 2016-03-04 NOTE — Progress Notes (Signed)
Pt called RN into room due to itching and c/o pain. Pt states that the percocet that was ordered made her itch more and wanted Norco instead. Also pt states that benadryl that was given to her earlier did not work. Pt wanted something else for itching if possible. RN called MD on call and received new orders. Will continue to monitor. Foye ClockKristina Bowden Gastro Associates LLCDoggett,RN
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
## 2016-03-05 LAB — RPR: RPR Ser Ql: NONREACTIVE

## 2016-03-05 NOTE — Discharge Instructions (Signed)

## 2016-03-05 NOTE — Plan of Care (Signed)
Problem: Nutritional: Goal: Mothers verbalization of comfort with breastfeeding process will improve Infant in NICU. Patient has DEBP. Encouraged pumping on a schedule. Mother trying to rest in am.

## 2016-03-05 NOTE — Lactation Note (Signed)
This note was copied from a baby's chart. Lactation Consultation Note  Patient Name: Boy Mertie Mooresbony Clowers [id number]'s Date: 03/05/2016  Follow up visit .  Mom is pumping every 2-3 hours and obtaining 10 mls of colostrum.  She is very motivated to provide breastmilk for her baby.  Reassured that we can assist her with latching baby in NICU when baby starts to show interest.  Encouraged to call with concerns/assist.   Maternal Data    Feeding    LATCH Score/Interventions                      Lactation Tools Discussed/Used     Consult Status      Huston FoleyMOULDEN, Alechia Lezama S 03/05/2016, 2:49 PM

## 2016-03-05 NOTE — Progress Notes (Signed)
Subjective: Postpartum Day #2: Cesarean Delivery Patient reports tolerating PO and no problems voiding.  Pumping; declines contraception. Infant doing well in NICU and pt able to ambulate to visit baby.  Objective: Vital signs in last 24 hours: Temp:  [97.6 F (36.4 C)-98.1 F (36.7 C)] 97.6 F (36.4 C) (02/03 0553) Pulse Rate:  [84-92] 90 (02/03 0553) Resp:  [18-20] 18 (02/03 0553) BP: (104-120)/(64-76) 110/69 (02/03 0553) SpO2:  [96 %-100 %] 100 % (02/02 1423)  Physical Exam:  General: alert, cooperative and no distress Lochia: appropriate Uterine Fundus: firm Incision: honeycomb intact, marked and unchanged DVT Evaluation: No evidence of DVT seen on physical exam.   Recent Labs  03/03/16 1029 03/04/16 0647  HGB 12.2 9.2*  HCT 34.6* 26.2*    Assessment/Plan: Status post Cesarean section. Doing well postoperatively.  Continue current care. Anticipate d/c home on 03/06/16.  Cam HaiSHAW, Mazell Aylesworth CNM 03/05/2016, 10:07 AM

## 2016-03-06 MED ORDER — HYDROCODONE-ACETAMINOPHEN 5-325 MG PO TABS: 1.0000 | ORAL_TABLET | ORAL | 0 refills | Status: DC | PRN

## 2016-03-06 MED ORDER — DOCUSATE SODIUM 100 MG PO CAPS: 100.0000 mg | ORAL_CAPSULE | Freq: Two times a day (BID) | ORAL | 0 refills | Status: DC

## 2016-03-06 NOTE — Lactation Note (Signed)
This note was copied from a baby's chart. Lactation Consultation Note  Patient Name: Boy Mertie Mooresbony Branscom [id number]'BToday's Date: 03/06/2016  Attempted to see mom twice and she was sleeping.  MBU RN states that she is pumping.  Patient [facility] so will go home with a hand pump and call WIC in the AM.   Maternal Data    Feeding    LATCH Score/Interventions                      Lactation Tools Discussed/Used     Consult Status      Huston FoleyMOULDEN, Leshonda Galambos S 03/06/2016, 3:25 PM

## 2016-03-06 NOTE — Discharge Summary (Signed)
OB Discharge Summary     Patient Name: Sarah Contreras DOB: July 27, 1985 MRN: [id number]  Date of admission: 03/03/2016 Delivering MD: Chancy Milroy   Date of discharge: 03/06/2016  Admitting diagnosis: WATER BROKE Intrauterine pregnancy: [redacted]w[redacted]d    Secondary diagnosis:  Active Problems:   Status post repeat low transverse cesarean section   Rupture of membranes with clear amniotic fluid  Additional problems: Preterm delivery     Discharge diagnosis: Preterm Pregnancy Delivered                                                                                                Post partum procedures:None  Augmentation: None  Complications: None  Hospital course:  Sceduled C/S   31y.o. yo G2P0202 at 341w1das admitted to the hospital 03/03/2016 for scheduled cesarean section with the following indication:Elective Repeat and Rupture of membranes.  Membrane Rupture Time/Date: 3:00 AM ,03/03/2016   Patient delivered a Viable infant.03/03/2016  Details of operation can be found in separate operative note.  Pateint had an uncomplicated postpartum course.  She is ambulating, tolerating a regular diet, passing flatus, and urinating well. Patient is discharged home in stable condition on  03/06/16         Physical exam  Vitals:   03/04/16 1825 03/05/16 0553 03/05/16 1846 03/06/16 0600  BP: 120/64 110/69 133/76 (!) 103/55  Pulse: 92 90 97 88  Resp: _0 Temp: 98 F (36.7 C) 97.6 F (36.4 C) 98.4 F (36.9 C) 98.7 F (37.1 C)  TempSrc: Oral Oral Oral Oral  SpO2:   100%   Weight:      Height:       General: alert, cooperative and no distress Lochia: appropriate Uterine Fundus: firm Incision: Healing well with no significant drainage, No significant erythema, Dressing is clean, dry, and intact DVT Evaluation: No evidence of DVT seen on physical exam. Negative Homan's sign. No cords or calf tenderness. Labs: Lab Results  Component Value Date   WBC 16.6 (H) 03/04/2016   HGB 9.2  (L) 03/04/2016   HCT 26.2 (L) 03/04/2016   MCV 80.1 03/04/2016   PLT 190 03/04/2016   CMP Latest Ref Rng & Units 10/15/2015  Glucose 65 - 99 mg/dL 82  BUN 7 - 25 mg/dL 7  Creatinine 0.50 - 1.10 mg/dL 0.63  Sodium 135 - 146 mmol/L 137  Potassium 3.5 - 5.3 mmol/L 4.3  Chloride 98 - 110 mmol/L 105  CO2 20 - 31 mmol/L 25  Calcium 8.6 - 10.2 mg/dL 9.0  Total Protein 6.1 - 8.1 g/dL 6.2  Total Bilirubin 0.2 - 1.2 mg/dL 0.2  Alkaline Phos 33 - 115 U/L 58  AST 10 - 30 U/L 13  ALT 6 - 29 U/L 10    Discharge instruction: per After Visit Summary and "Baby and Me Booklet".  After visit meds:  Allergies as of 03/06/2016      Reactions   Sumatriptan Other (See Comments)   She felt "horribly blah like a zombie" and did not improve headache      Medication List  STOP taking these medications   ACCU-CHEK AVIVA PLUS w/Device Kit   ACCU-CHEK SOFTCLIX LANCETS lancets   aspirin EC 81 MG tablet   glucose blood test strip   zolpidem 5 MG tablet Commonly known as:  AMBIEN     TAKE these medications   albuterol 108 (90 Base) MCG/ACT inhaler Commonly known as:  PROVENTIL HFA Inhale 2 puffs into the lungs every 4 (four) hours as needed for wheezing or shortness of breath.   docusate sodium 100 MG capsule Commonly known as:  COLACE Take 1 capsule (100 mg total) by mouth 2 (two) times daily.   HYDROcodone-acetaminophen 5-325 MG tablet Commonly known as:  NORCO/VICODIN Take 1-2 tablets by mouth every 4 (four) hours as needed for moderate pain.   omeprazole 20 MG tablet Commonly known as:  PRILOSEC OTC Take 1 tablet (20 mg total) by mouth daily.   PRENATAL COMPLETE 14-0.4 MG Tabs Take 1 tablet by mouth daily.   sertraline 50 MG tablet Commonly known as:  ZOLOFT TAKE 2 TABLETS BY MOUTH EVERY DAY What changed:  See the new instructions.       Diet: carb modified diet  Activity: Advance as tolerated. Pelvic rest for 6 weeks.   Outpatient follow up:6 weeks Follow up  Appt:Future Appointments Date Time Provider [place]  04/07/2016 10:20 AM Luvenia Redden, PA-C [facility]   Follow up Visit:No Follow-up on file.  Postpartum contraception: None, has same sex partner  Newborn Data: Live born female  Birth Weight: 6 lb 12.3 oz (3070 g) APGAR: 8, 9  Baby Feeding: Breast Disposition:NICU   03/06/2016 Katherine Basset, DO  OB Fellow

## 2016-03-06 NOTE — Plan of Care (Signed)
Problem: Nutritional: Goal: Mothers verbalization of comfort with breastfeeding process will improve Outcome: Completed/Met Date Met: 03/06/16 Patient has DEBP and is pumping. Provided information about frequency and breast care.   Problem: Skin Integrity: Goal: Demonstration of wound healing without infection will improve Outcome: Completed/Met Date Met: 03/06/16 Incision care discussed with patient and reviewed when to call the doctor.

## 2016-03-08 ENCOUNTER — Ambulatory Visit: Payer: Self-pay

## 2016-03-08 NOTE — Lactation Note (Signed)
This note was copied from a baby's chart. Lactation Consultation Note  Patient Name: Sarah Contreras OZHYQ'MToday's Date: 03/08/2016 Reason for consult: Follow-up assessment    With this mom of a NICU baby, now 295 days old, and [facility]. He is now 35 6/7 weeks CGA. Mom is pumping and bottle feeding at this time. She is active with WIC. I encourgagd her to call for a DEP. She is now pumping in the NICU with DEP, but using hand pump at home. I asked that her nurse give mom and extra hand pump on discharge. I encouraged mom to call lactation for an o/p consult, if she want to transition to breast feeding. Mom given lactation o/p information sheet, and told to call us for any questions/conerns. I also advised mom to pre pump, since she states her milk supply is abundant, and then try and latch Jayden.    Maternal Data    Feeding Feeding Type: Breast Milk Nipple Type: Slow - flow  LATCH Score/Interventions                      Lactation Tools Discussed/Used     Consult Status Consult Status: Follow-up Follow-up type: Call as needed    Alfred LevinsLee, Alpha Mysliwiec Anne 03/08/2016, 10:23 AM

## 2016-03-10 ENCOUNTER — Encounter: Payer: Self-pay | Admitting: Obstetrics and Gynecology

## 2016-03-11 ENCOUNTER — Inpatient Hospital Stay (HOSPITAL_COMMUNITY)
Admission: AD | Admit: 2016-03-11 | Discharge: 2016-03-11 | Disposition: A | Discharge status: Home / Self Care | Payer: Medicaid Other | Source: Ambulatory Visit | Attending: Obstetrics & Gynecology | Admitting: Obstetrics & Gynecology

## 2016-03-11 DIAGNOSIS — Z4889 Encounter for other specified surgical aftercare: Secondary | ICD-10-CM | POA: Diagnosis not present

## 2016-03-11 DIAGNOSIS — K668 Other specified disorders of peritoneum: Secondary | ICD-10-CM | POA: Insufficient documentation

## 2016-03-11 DIAGNOSIS — O9089 Other complications of the puerperium, not elsewhere classified: Secondary | ICD-10-CM | POA: Insufficient documentation

## 2016-03-11 LAB — URINALYSIS, ROUTINE W REFLEX MICROSCOPIC
BILIRUBIN URINE: NEGATIVE
Glucose, UA: NEGATIVE mg/dL
KETONES UR: NEGATIVE mg/dL
NITRITE: NEGATIVE
PH: 6 (ref 5.0–8.0)
Protein, ur: NEGATIVE mg/dL
SPECIFIC GRAVITY, URINE: 1.023 (ref 1.005–1.030)

## 2016-03-11 MED ORDER — IBUPROFEN 200 MG PO TABS: 600.0000 mg | ORAL_TABLET | Freq: Four times a day (QID) | ORAL | 0 refills | Status: DC | PRN

## 2016-03-11 MED ORDER — HYDROCODONE-ACETAMINOPHEN 5-325 MG PO TABS: 1.0000 | ORAL_TABLET | ORAL | 0 refills | Status: DC | PRN

## 2016-03-11 NOTE — MAU Provider Note (Signed)
History     CSN: [id number]  Arrival date and time: 03/11/16 [id number]   First Provider Initiated Contact with Patient 03/11/16 1011      Chief Complaint  Patient presents with  . Drainage from Incision   Patient is a 31 y/o G2P2 @ s/p RLTCS on 2/1 for SROM. Patient had been doing well postoperatively but had continued pain. She reports some greendischarge was noted on her honeycomb before it fell off yesterday. She reports her pain is worse at her incision, but the pain worsens with breast feeding. She has been taking her percocet, but has not been taking any IBU. She denies any fevers or chills.     OB History    Gravida Para Term Preterm AB Living   2 2   2   2    SAB TAB Ectopic Multiple Live Births         0 2      Past Medical History:  Diagnosis Date  . Anxiety   . Asthma   . Depression   . GERD (gastroesophageal reflux disease)   . Gestational diabetes   . Migraine     Past Surgical History:  Procedure Laterality Date  . BARTHOLIN GLAND CYST EXCISION    . CESAREAN SECTION    . CESAREAN SECTION N/A 03/03/2016   Procedure: CESAREAN SECTION;  Surgeon: Hermina StaggersMichael L Ervin, MD;  [facility];  Service: Obstetrics;  Laterality: N/A;  . CHOLECYSTECTOMY    . RECTAL SURGERY     "huge tear"    Family History  Problem Relation Age of Onset  . Diabetes Other   . Hypertension Other   . Cancer Other     Social History  Substance Use Topics  . Smoking status: Never Smoker  . Smokeless tobacco: Never Used  . Alcohol use No    Allergies:  Allergies  Allergen Reactions  . Sumatriptan Other (See Comments)    She felt "horribly blah like a zombie" and did not improve headache    Prescriptions Prior to Admission  Medication Sig Dispense Refill Last Dose  . Prenatal Vit-Fe Fumarate-FA (PRENATAL COMPLETE) 14-0.4 MG TABS Take 1 tablet by mouth daily. 30 each 0 03/10/2016 at Unknown time  . sertraline (ZOLOFT) 50 MG tablet TAKE 2 TABLETS BY MOUTH EVERY DAY 30  tablet 0 Past Week at Unknown time  . albuterol (PROVENTIL HFA) 108 (90 Base) MCG/ACT inhaler Inhale 2 puffs into the lungs every 4 (four) hours as needed for wheezing or shortness of breath. 1 Inhaler 2 prn  . docusate sodium (COLACE) 100 MG capsule Take 1 capsule (100 mg total) by mouth 2 (two) times daily. (Patient not taking: Reported on 03/11/2016) 30 capsule 0 Not Taking at Unknown time  . omeprazole (PRILOSEC OTC) 20 MG tablet Take 1 tablet (20 mg total) by mouth daily. (Patient not taking: Reported on 03/11/2016) 30 tablet 3 Not Taking at Unknown time  . [DISCONTINUED] HYDROcodone-acetaminophen (NORCO/VICODIN) 5-325 MG tablet Take 1-2 tablets by mouth every 4 (four) hours as needed for moderate pain. (Patient not taking: Reported on 03/11/2016) 30 tablet 0 Completed Course at Unknown time    Review of Systems  Constitutional: Negative for chills and fatigue.  HENT: Negative for rhinorrhea and sore throat.   Respiratory: Negative for cough and shortness of breath.   Cardiovascular: Negative for chest pain and palpitations.  Gastrointestinal: Positive for abdominal pain. Negative for constipation, diarrhea, nausea and vomiting.  Genitourinary: Negative for difficulty urinating, dysuria, flank pain  and frequency.  Neurological: Negative for dizziness and seizures.   Physical Exam   Blood pressure 125/76, pulse 100, temperature 98 F (36.7 C), temperature source Oral, resp. rate 18, SpO2 100 %, unknown if currently breastfeeding.  Physical Exam  Constitutional: She is oriented to person, place, and time. She appears well-developed and well-nourished.  HENT:  Head: Normocephalic and atraumatic.  Cardiovascular: Normal rate and intact distal pulses.   Respiratory: Effort normal. No respiratory distress.  GI: Soft. Bowel sounds are normal. She exhibits no distension. There is no tenderness. There is no rebound and no guarding.  Neurological: She is alert and oriented to person, place, and  time.  Skin: Skin is warm and dry.  Small amount of granulation tissue noted at the scar. Silver nitrate applied. No erythema or discharge noted.   Psychiatric: She has a normal mood and affect.    MAU Course  Procedures  MDM In MAU incision is examined. Small amount of granulation noted. No signs of infection. Silver nitrate was applied. Assessment and Plan  #1 Routine post-operative care. No concerns. Continue to monitor. Advised IBU and small amount or percocet prescribed.   Ernestina Penna MD 03/11/2016, 11:04 AM

## 2016-03-11 NOTE — Discharge Instructions (Signed)

## 2016-03-11 NOTE — MAU Note (Signed)
C/S on Feb 1, incision has been draining green drainage, pain is getting worse instead of better.  Honeycomb dressing fell off last night.  Denies fever.

## 2016-03-15 ENCOUNTER — Encounter (HOSPITAL_COMMUNITY): Admission: RE | Admit: 2016-03-15 | Payer: Medicaid Other | Source: Ambulatory Visit

## 2016-03-16 ENCOUNTER — Other Ambulatory Visit: Payer: Self-pay | Admitting: Advanced Practice Midwife

## 2016-03-16 ENCOUNTER — Inpatient Hospital Stay (HOSPITAL_COMMUNITY): Admission: RE | Admit: 2016-03-16 | Payer: Medicaid Other | Source: Ambulatory Visit | Admitting: Family Medicine

## 2016-03-16 ENCOUNTER — Other Ambulatory Visit: Payer: Self-pay | Admitting: Obstetrics & Gynecology

## 2016-03-16 ENCOUNTER — Encounter (HOSPITAL_COMMUNITY): Admission: RE | Payer: Self-pay | Source: Ambulatory Visit

## 2016-03-16 DIAGNOSIS — F329 Major depressive disorder, single episode, unspecified: Secondary | ICD-10-CM

## 2016-03-16 DIAGNOSIS — O99342 Other mental disorders complicating pregnancy, second trimester: Principal | ICD-10-CM

## 2016-03-16 SURGERY — Surgical Case
Anesthesia: Choice | Site: Abdomen

## 2016-03-17 ENCOUNTER — Other Ambulatory Visit (HOSPITAL_COMMUNITY): Payer: Self-pay | Admitting: Obstetrics and Gynecology

## 2016-03-17 ENCOUNTER — Encounter: Payer: Self-pay | Admitting: Family Medicine

## 2016-03-24 ENCOUNTER — Other Ambulatory Visit: Payer: Self-pay | Admitting: Family Medicine

## 2016-03-24 ENCOUNTER — Other Ambulatory Visit: Payer: Self-pay | Admitting: Advanced Practice Midwife

## 2016-03-30 ENCOUNTER — Telehealth: Payer: Self-pay | Admitting: *Deleted

## 2016-03-30 ENCOUNTER — Other Ambulatory Visit: Payer: Self-pay | Admitting: General Practice

## 2016-03-30 ENCOUNTER — Encounter: Payer: Self-pay | Admitting: General Practice

## 2016-03-30 DIAGNOSIS — G4709 Other insomnia: Secondary | ICD-10-CM

## 2016-03-30 MED ORDER — ZOLPIDEM TARTRATE 5 MG PO TABS: 5.0000 mg | ORAL_TABLET | Freq: Every evening | ORAL | 0 refills | Status: DC | PRN

## 2016-03-30 NOTE — Progress Notes (Unsigned)
ambien called in to [facility] pharmacy. 

## 2016-03-30 NOTE — Telephone Encounter (Signed)
Per message from Dr. Shawnie PonsPratt , received refill request for ambien 5mg  . Dr. Shawnie PonsPratt wants us to call patient and ask why she needs this as she does not like to give sleeping medicine to someone with a newborn at home.    I called Sarah Contreras and left a message we are calling to discuss a refill request- please call our office.

## 2016-04-04 NOTE — Telephone Encounter (Signed)
See patient email dated 03/30/16 - addresses her request for ambien.

## 2016-04-07 ENCOUNTER — Ambulatory Visit: Payer: Self-pay | Admitting: Medical

## 2016-04-11 ENCOUNTER — Other Ambulatory Visit: Payer: Self-pay | Admitting: Obstetrics & Gynecology

## 2016-04-11 DIAGNOSIS — O99342 Other mental disorders complicating pregnancy, second trimester: Principal | ICD-10-CM

## 2016-04-11 DIAGNOSIS — F32A Depression, unspecified: Secondary | ICD-10-CM

## 2016-04-11 DIAGNOSIS — F329 Major depressive disorder, single episode, unspecified: Secondary | ICD-10-CM

## 2016-05-23 ENCOUNTER — Emergency Department
Admission: EM | Admit: 2016-05-23 | Discharge: 2016-05-23 | Disposition: A | Discharge status: Home / Self Care | Payer: Medicaid Other | Attending: Emergency Medicine | Admitting: Emergency Medicine

## 2016-05-23 ENCOUNTER — Emergency Department: Payer: Medicaid Other

## 2016-05-23 ENCOUNTER — Encounter: Payer: Self-pay | Admitting: Emergency Medicine

## 2016-05-23 DIAGNOSIS — Z791 Long term (current) use of non-steroidal anti-inflammatories (NSAID): Secondary | ICD-10-CM | POA: Insufficient documentation

## 2016-05-23 DIAGNOSIS — Z79899 Other long term (current) drug therapy: Secondary | ICD-10-CM | POA: Diagnosis not present

## 2016-05-23 DIAGNOSIS — R0789 Other chest pain: Secondary | ICD-10-CM | POA: Diagnosis not present

## 2016-05-23 DIAGNOSIS — R0689 Other abnormalities of breathing: Secondary | ICD-10-CM | POA: Diagnosis not present

## 2016-05-23 DIAGNOSIS — R102 Pelvic and perineal pain: Secondary | ICD-10-CM | POA: Diagnosis present

## 2016-05-23 DIAGNOSIS — R079 Chest pain, unspecified: Secondary | ICD-10-CM

## 2016-05-23 DIAGNOSIS — J45909 Unspecified asthma, uncomplicated: Secondary | ICD-10-CM | POA: Diagnosis not present

## 2016-05-23 LAB — CBC
HEMATOCRIT: 35.9 % (ref 35.0–47.0)
HEMOGLOBIN: 11.9 g/dL — AB (ref 12.0–16.0)
MCH: 26 pg (ref 26.0–34.0)
MCHC: 33.2 g/dL (ref 32.0–36.0)
MCV: 78.5 fL — AB (ref 80.0–100.0)
Platelets: 311 10*3/uL (ref 150–440)
RBC: 4.57 MIL/uL (ref 3.80–5.20)
RDW: 14.7 % — ABNORMAL HIGH (ref 11.5–14.5)
WBC: 6.7 10*3/uL (ref 3.6–11.0)

## 2016-05-23 LAB — BASIC METABOLIC PANEL
ANION GAP: 7 (ref 5–15)
BUN: 14 mg/dL (ref 6–20)
CHLORIDE: 105 mmol/L (ref 101–111)
CO2: 26 mmol/L (ref 22–32)
Calcium: 9 mg/dL (ref 8.9–10.3)
Creatinine, Ser: 0.81 mg/dL (ref 0.44–1.00)
GFR calc non Af Amer: >60 mL/min (ref >60)
Glucose, Bld: 92 mg/dL (ref 65–99)
POTASSIUM: 4.2 mmol/L (ref 3.5–5.1)
SODIUM: 138 mmol/L (ref 135–145)

## 2016-05-23 LAB — URINALYSIS, COMPLETE (UACMP) WITH MICROSCOPIC
BILIRUBIN URINE: NEGATIVE
GLUCOSE, UA: NEGATIVE mg/dL
KETONES UR: NEGATIVE mg/dL
Nitrite: NEGATIVE
PH: 5 (ref 5.0–8.0)
Protein, ur: NEGATIVE mg/dL
SPECIFIC GRAVITY, URINE: 1.018 (ref 1.005–1.030)

## 2016-05-23 LAB — FIBRIN DERIVATIVES D-DIMER (ARMC ONLY): FIBRIN DERIVATIVES D-DIMER (ARMC): 678.06 — AB (ref 0.00–499.00)

## 2016-05-23 LAB — BRAIN NATRIURETIC PEPTIDE: B NATRIURETIC PEPTIDE 5: 10 pg/mL (ref 0.0–100.0)

## 2016-05-23 LAB — TROPONIN I: Troponin I: <0.03 ng/mL (ref <0.03)

## 2016-05-23 LAB — PREGNANCY, URINE: Preg Test, Ur: NEGATIVE

## 2016-05-23 MED ORDER — TRAMADOL HCL 50 MG PO TABS: 50.0000 mg | ORAL_TABLET | Freq: Four times a day (QID) | ORAL | 0 refills | Status: AC | PRN

## 2016-05-23 MED ORDER — IOPAMIDOL (ISOVUE-370) INJECTION 76%
75.0000 mL | Freq: Once | INTRAVENOUS | Status: AC | PRN
  Administered 2016-05-23: 75 mL via INTRAVENOUS

## 2016-05-23 MED ORDER — ONDANSETRON 4 MG PO TBDP: 4.0000 mg | ORAL_TABLET | Freq: Three times a day (TID) | ORAL | 0 refills | Status: DC | PRN

## 2016-05-23 NOTE — ED Triage Notes (Signed)
Pt reports centralized chest pain that started 4 days ago, reports worse today. Pt also with c/o pelvic pain, was seen at [facility] and told she had BV and has been on flagyl for 1 week. Reports sx are not improving.

## 2016-05-23 NOTE — ED Notes (Signed)
Patient transported to CT 

## 2016-05-23 NOTE — ED Notes (Signed)
Patient transported to Ultrasound 

## 2016-05-23 NOTE — Discharge Instructions (Signed)
Please follow up with gynecology for further evaluation of your pelvic pain.
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
## 2016-05-23 NOTE — ED Notes (Signed)
Lab notified to add on Urine Pregnancy 

## 2016-09-21 NOTE — Addendum Note (Signed)
Addendum  created 09/21/16 1137 by Achille Rich, MD   Sign clinical note

## 2017-09-03 ENCOUNTER — Emergency Department
Admission: EM | Admit: 2017-09-03 | Discharge: 2017-09-04 | Disposition: A | Discharge status: Home / Self Care | Payer: Medicaid Other | Attending: Emergency Medicine | Admitting: Emergency Medicine

## 2017-09-03 ENCOUNTER — Encounter: Payer: Self-pay | Admitting: Emergency Medicine

## 2017-09-03 ENCOUNTER — Other Ambulatory Visit: Payer: Self-pay

## 2017-09-03 DIAGNOSIS — F32A Depression, unspecified: Secondary | ICD-10-CM

## 2017-09-03 DIAGNOSIS — Z79899 Other long term (current) drug therapy: Secondary | ICD-10-CM | POA: Insufficient documentation

## 2017-09-03 DIAGNOSIS — F329 Major depressive disorder, single episode, unspecified: Secondary | ICD-10-CM | POA: Insufficient documentation

## 2017-09-03 DIAGNOSIS — J45909 Unspecified asthma, uncomplicated: Secondary | ICD-10-CM | POA: Insufficient documentation

## 2017-09-03 LAB — COMPREHENSIVE METABOLIC PANEL
ALK PHOS: 93 U/L (ref 38–126)
ALT: 43 U/L (ref 0–44)
AST: 32 U/L (ref 15–41)
Albumin: 4.5 g/dL (ref 3.5–5.0)
Anion gap: 10 (ref 5–15)
BILIRUBIN TOTAL: 0.5 mg/dL (ref 0.3–1.2)
BUN: 10 mg/dL (ref 6–20)
CALCIUM: 8.9 mg/dL (ref 8.9–10.3)
CO2: 24 mmol/L (ref 22–32)
Chloride: 106 mmol/L (ref 98–111)
Creatinine, Ser: 0.93 mg/dL (ref 0.44–1.00)
GFR calc non Af Amer: >60 mL/min (ref >60)
Glucose, Bld: 113 mg/dL — ABNORMAL HIGH (ref 70–99)
Potassium: 3.8 mmol/L (ref 3.5–5.1)
SODIUM: 140 mmol/L (ref 135–145)
TOTAL PROTEIN: 7.9 g/dL (ref 6.5–8.1)

## 2017-09-03 LAB — URINE DRUG SCREEN, QUALITATIVE (ARMC ONLY)
AMPHETAMINES, UR SCREEN: NOT DETECTED
Barbiturates, Ur Screen: NOT DETECTED
CANNABINOID 50 NG, UR ~~LOC~~: NOT DETECTED
Cocaine Metabolite,Ur ~~LOC~~: NOT DETECTED
MDMA (Ecstasy)Ur Screen: NOT DETECTED
Methadone Scn, Ur: NOT DETECTED
Opiate, Ur Screen: NOT DETECTED
PHENCYCLIDINE (PCP) UR S: NOT DETECTED
Tricyclic, Ur Screen: POSITIVE — AB

## 2017-09-03 LAB — CBC
HCT: 41.1 % (ref 35.0–47.0)
Hemoglobin: 14.6 g/dL (ref 12.0–16.0)
MCH: 29.3 pg (ref 26.0–34.0)
MCHC: 35.6 g/dL (ref 32.0–36.0)
MCV: 82.3 fL (ref 80.0–100.0)
PLATELETS: 311 10*3/uL (ref 150–440)
RBC: 5 MIL/uL (ref 3.80–5.20)
RDW: 14.6 % — AB (ref 11.5–14.5)
WBC: 10.4 10*3/uL (ref 3.6–11.0)

## 2017-09-03 LAB — PREGNANCY, URINE: PREG TEST UR: NEGATIVE

## 2017-09-03 LAB — ETHANOL: Alcohol, Ethyl (B): <10 mg/dL (ref <10)

## 2017-09-03 MED ORDER — ARIPIPRAZOLE 15 MG PO TABS
15.0000 mg | ORAL_TABLET | Freq: Once | ORAL | Status: AC
  Administered 2017-09-04: 15 mg via ORAL
  Filled 2017-09-03: qty 3
  Filled 2017-09-03: qty 1

## 2017-09-03 MED ORDER — ARIPIPRAZOLE 15 MG PO TABS: 15.0000 mg | ORAL_TABLET | Freq: Every day | ORAL | 0 refills | Status: AC

## 2017-09-03 MED ORDER — ESCITALOPRAM OXALATE 10 MG PO TABS: 10.0000 mg | ORAL_TABLET | Freq: Every morning | ORAL | 0 refills | Status: DC

## 2017-09-03 MED ORDER — ESCITALOPRAM OXALATE 10 MG PO TABS
10.0000 mg | ORAL_TABLET | Freq: Once | ORAL | Status: AC
  Administered 2017-09-04: 10 mg via ORAL
  Filled 2017-09-03: qty 1

## 2017-09-03 NOTE — ED Notes (Signed)
Pt. Transferred from Triage to room 22 after dressing out and screening for contraband. Report to include Situation, Background, Assessment and Recommendations from Michelle RN. Pt. Oriented to Quad including Q15 minute rounds as well as Rover and Officer for their protection. Patient is alert and oriented, warm and dry in no acute distress. Patient denies SI, HI, and AVH. Pt. Encouraged to let me know if needs arise.  

## 2017-09-03 NOTE — ED Notes (Signed)
Pt removed one pair of grey shoes,red underwear, tan jeans, black tee shirt, blue cell phone, ball cap and one hair tie, one black wallet with 1$ in it cash. . Items secured in white [facility] pt bag and labeled with pt identifier.
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
## 2017-09-03 NOTE — ED Triage Notes (Signed)
Pt arrives ambulatory to triage with c/o depression. Pt is IVC'd at this time due to FB posting "mobody takes you series until you kill yourself". Pt denies SI or HI at this time and is calm and cooperative and in NAD.

## 2017-09-03 NOTE — ED Notes (Signed)
Pt stuck for blood x 2 by this RN with collection on second stick.

## 2017-09-03 NOTE — Discharge Instructions (Signed)
Please resume taking your Lexapro and your Abilify as prescribed and make sure you see her psychiatrist in 3 days as scheduled.  Return to the emergency department sooner for any concerns whatsoever.  It was a pleasure to take care of you today, and thank you for coming to our emergency department.  If you have any questions or concerns before leaving please ask the nurse to grab me and I'm more than happy to go through your aftercare instructions again.  If you were prescribed any opioid pain medication today such as Norco, Vicodin, Percocet, morphine, hydrocodone, or oxycodone please make sure you do not drive when you are taking this medication as it can alter your ability to drive safely.  If you have any concerns once you are home that you are not improving or are in fact getting worse before you can make it to your follow-up appointment, please do not hesitate to call [phone] and come back for further evaluation.  Merrily BrittleNeil Adaysha Dubinsky, MD  Results for orders placed or performed during the hospital encounter of 09/03/17  Comprehensive metabolic panel  Result Value Ref Range   Sodium 140 135 - 145 mmol/L   Potassium 3.8 3.5 - 5.1 mmol/L   Chloride 106 98 - 111 mmol/L   CO2 24 22 - 32 mmol/L   Glucose, Bld 113 (H) 70 - 99 mg/dL   BUN 10 6 - 20 mg/dL   Creatinine, Ser 1.610.93 0.44 - 1.00 mg/dL   Calcium 8.9 8.9 - 09.610.3 mg/dL   Total Protein 7.9 6.5 - 8.1 g/dL   Albumin 4.5 3.5 - 5.0 g/dL   AST 32 15 - 41 U/L   ALT 43 0 - 44 U/L   Alkaline Phosphatase 93 38 - 126 U/L   Total Bilirubin 0.5 0.3 - 1.2 mg/dL   GFR calc non Af Amer >60 >60 mL/min   GFR calc Af Amer >60 >60 mL/min   Anion gap 10 5 - 15  Ethanol  Result Value Ref Range   Alcohol, Ethyl (B) <10 <10 mg/dL  cbc  Result Value Ref Range   WBC 10.4 3.6 - 11.0 K/uL   RBC 5.00 3.80 - 5.20 MIL/uL   Hemoglobin 14.6 12.0 - 16.0 g/dL   HCT 04.541.1 40.935.0 - 81.147.0 %   MCV 82.3 80.0 - 100.0 fL   MCH 29.3 26.0 - 34.0 pg   MCHC 35.6 32.0 - 36.0 g/dL     RDW 91.414.6 (H) 78.211.5 - 14.5 %   Platelets 311 150 - 440 K/uL  Urine Drug Screen, Qualitative  Result Value Ref Range   Tricyclic, Ur Screen POSITIVE (A) NONE DETECTED   Amphetamines, Ur Screen NONE DETECTED NONE DETECTED   MDMA (Ecstasy)Ur Screen NONE DETECTED NONE DETECTED   Cocaine Metabolite,Ur [place] NONE DETECTED NONE DETECTED   Opiate, Ur Screen NONE DETECTED NONE DETECTED   Phencyclidine (PCP) Ur S NONE DETECTED NONE DETECTED   Cannabinoid 50 Ng, Ur  NONE DETECTED NONE DETECTED   Barbiturates, Ur Screen NONE DETECTED NONE DETECTED   Benzodiazepine, Ur Scrn TEST NOT PERFORMED, REAGENT NOT AVAILABLE (A) NONE DETECTED   Methadone Scn, Ur NONE DETECTED NONE DETECTED  Pregnancy, urine  Result Value Ref Range   Preg Test, Ur NEGATIVE NEGATIVE

## 2017-09-04 NOTE — ED Notes (Signed)
Hourly rounding reveals patient in room. No complaints, stable, in no acute distress. Q15 minute rounds and monitoring via Rover and Officer to continue.   

## 2017-10-09 ENCOUNTER — Encounter: Payer: Self-pay | Admitting: *Deleted

## 2018-03-24 ENCOUNTER — Emergency Department
Admission: EM | Admit: 2018-03-24 | Discharge: 2018-03-24 | Discharge status: Against Medical Advice | Payer: Medicaid Other | Attending: Emergency Medicine | Admitting: Emergency Medicine

## 2018-03-24 ENCOUNTER — Other Ambulatory Visit: Payer: Self-pay

## 2018-03-24 ENCOUNTER — Encounter: Payer: Self-pay | Admitting: Emergency Medicine

## 2018-03-24 DIAGNOSIS — R1110 Vomiting, unspecified: Secondary | ICD-10-CM | POA: Insufficient documentation

## 2018-03-24 DIAGNOSIS — Z5321 Procedure and treatment not carried out due to patient leaving prior to being seen by health care provider: Secondary | ICD-10-CM | POA: Insufficient documentation

## 2018-03-24 NOTE — ED Notes (Signed)
No answer when called 

## 2018-03-24 NOTE — ED Triage Notes (Signed)
Pt to ER with c/o vomiting, diarrhea and body aches starting last night. Mother here with son who has same symptoms.

## 2018-03-29 IMAGING — CT CT ANGIO CHEST
2 of 6 series · 19 of 46 positions shown · IV contrast (APPLIED)
Comparison: 06/25/2015

CLINICAL DATA: Worsening pleuritic chest pain for 4 days. Elevated
D-dimer.

EXAM:
CT ANGIOGRAPHY CHEST WITH CONTRAST
TECHNIQUE: Multidetector CT imaging of the chest was performed using the
standard protocol during bolus administration of intravenous
contrast. Multiplanar CT image reconstructions and MIPs were
obtained to evaluate the vascular anatomy.
CONTRAST:  75 mL Isovue 370

[Series 5: thins · axial · 0.64mm/px · z∈[-571,-326]mm · 17 of 269 slices shown]
[im 12/269  lung]
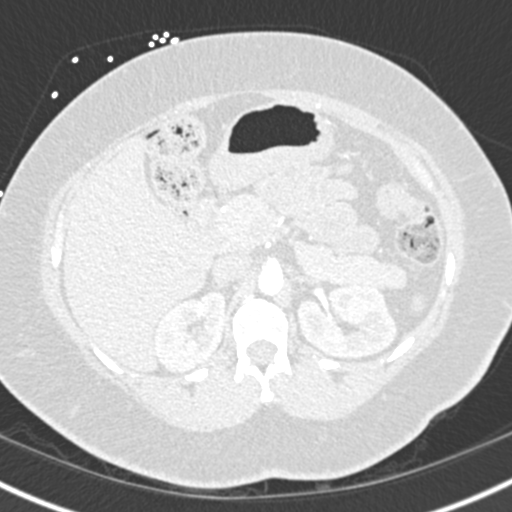
[im 24/269  soft-tissue]
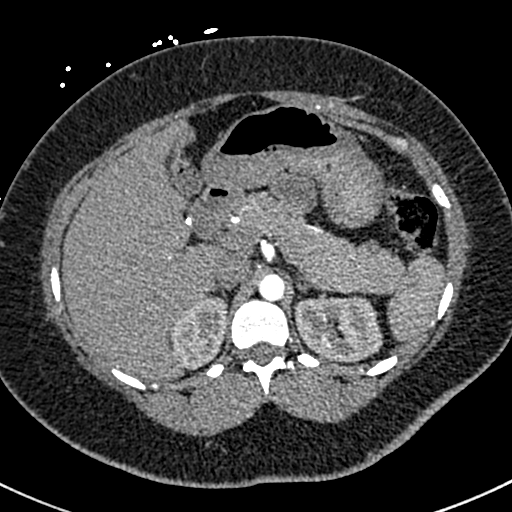
[im 47/269  lung]
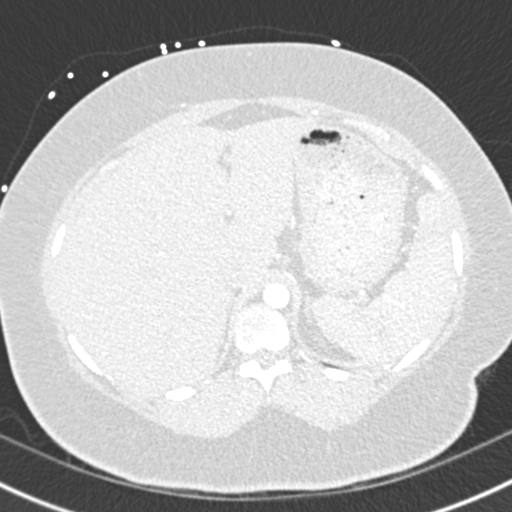
[im 59/269  soft-tissue]
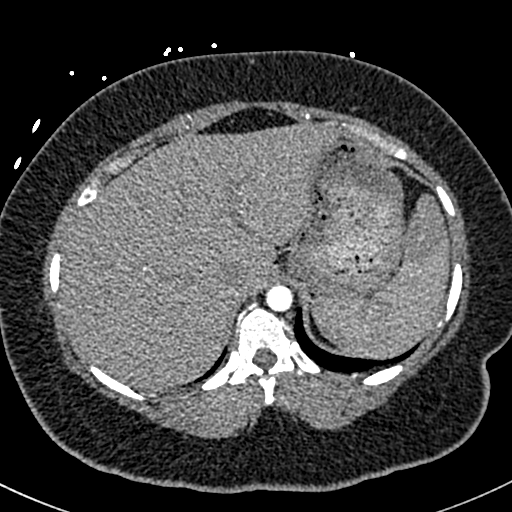
[im 70/269  lung]
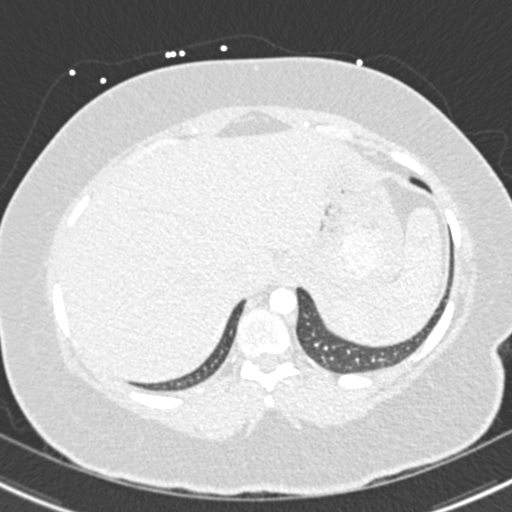
[im 94/269  soft-tissue]
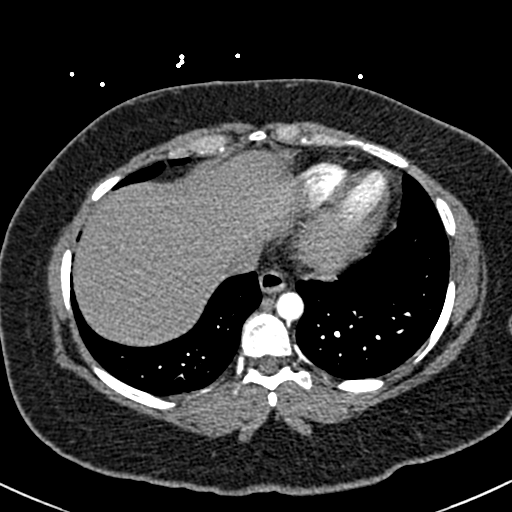
[im 105/269  lung]
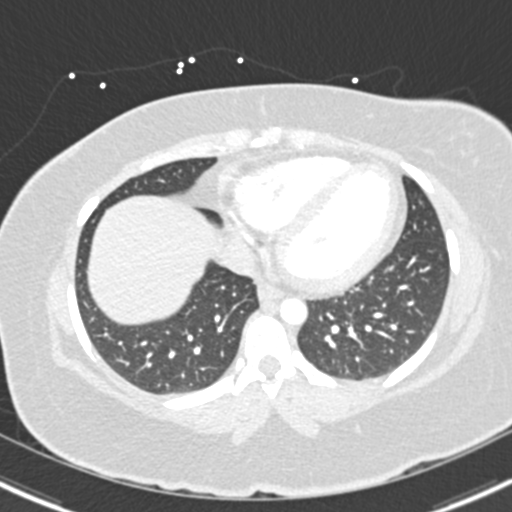
[im 117/269  soft-tissue]
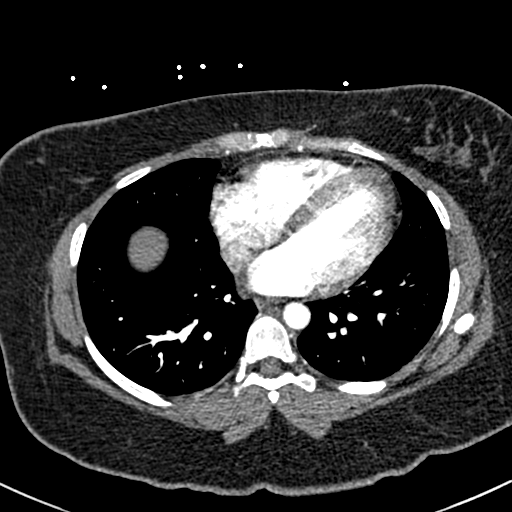
[im 140/269  lung]
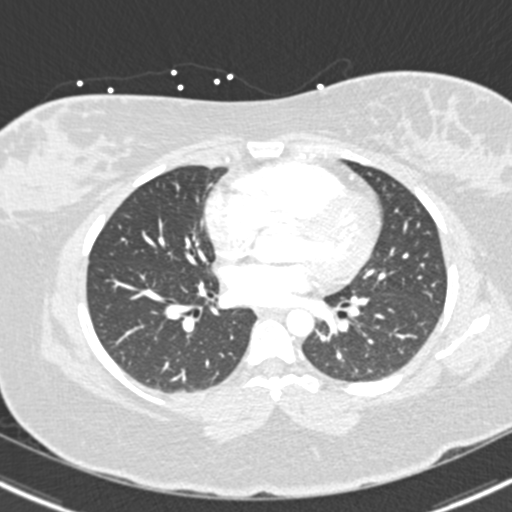
[im 152/269  soft-tissue]
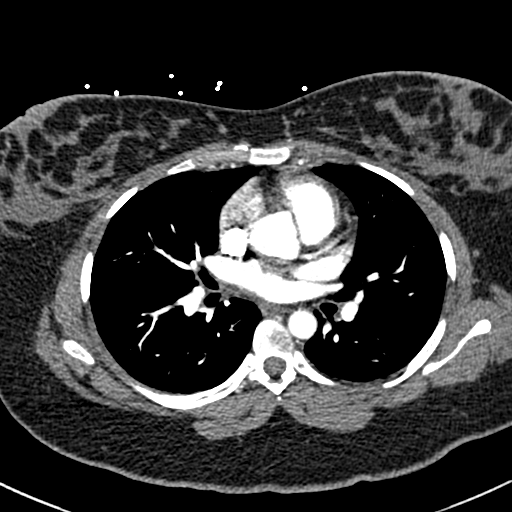
[im 164/269  lung]
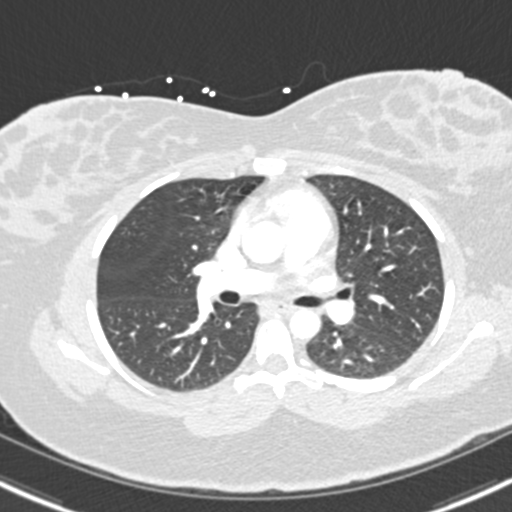
[im 175/269  soft-tissue]
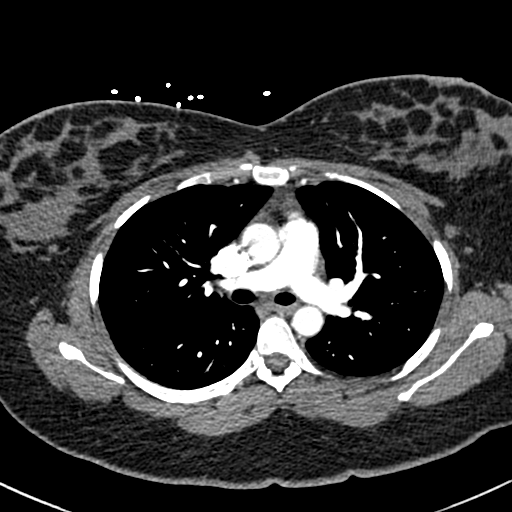
[im 199/269  lung]
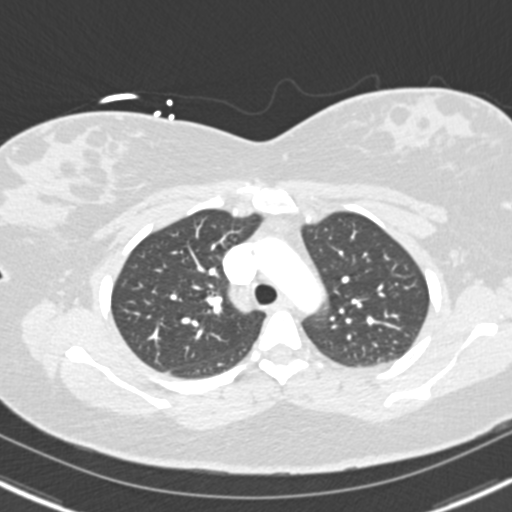
[im 210/269  soft-tissue]
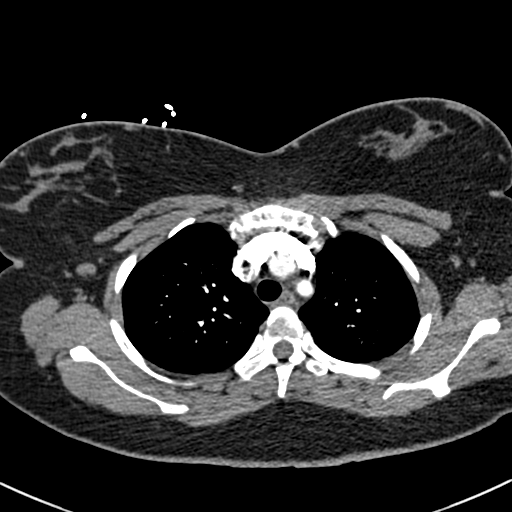
[im 222/269  lung]
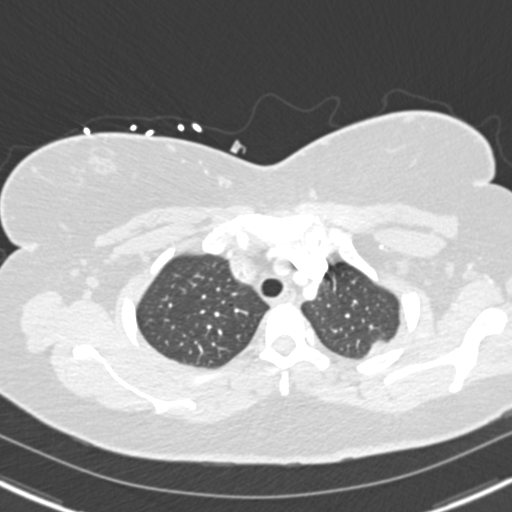
[im 245/269  soft-tissue]
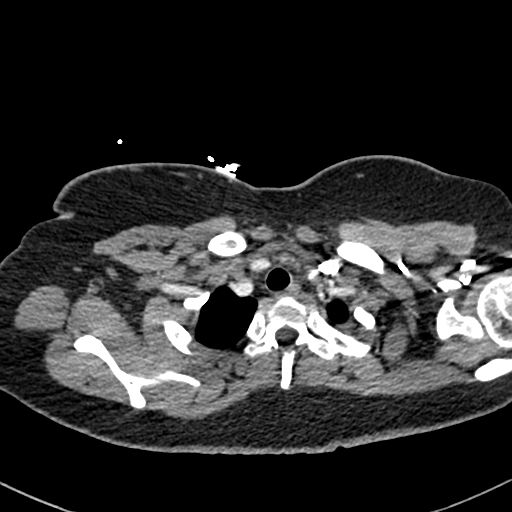
[im 257/269  lung]
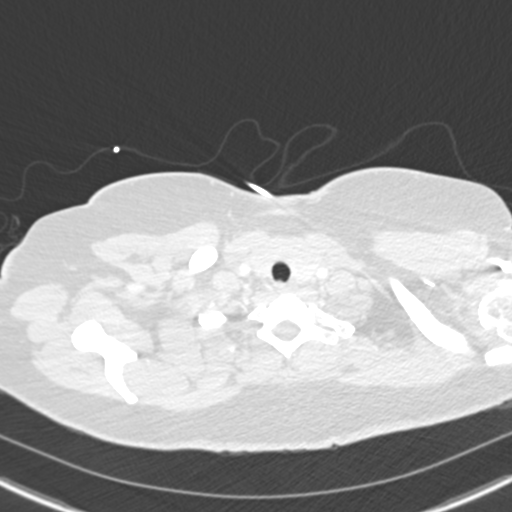

[Series 7: coronal mpr · coronal · 0.54mm/px · 2 of 106 slices shown]
[im 36/106  soft-tissue]
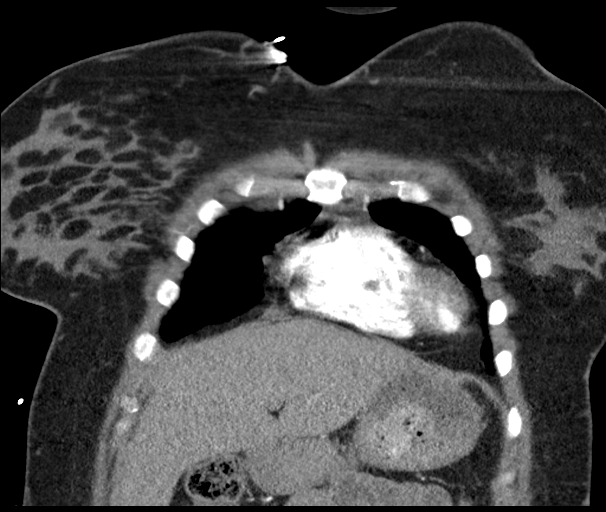
[im 71/106  soft-tissue]
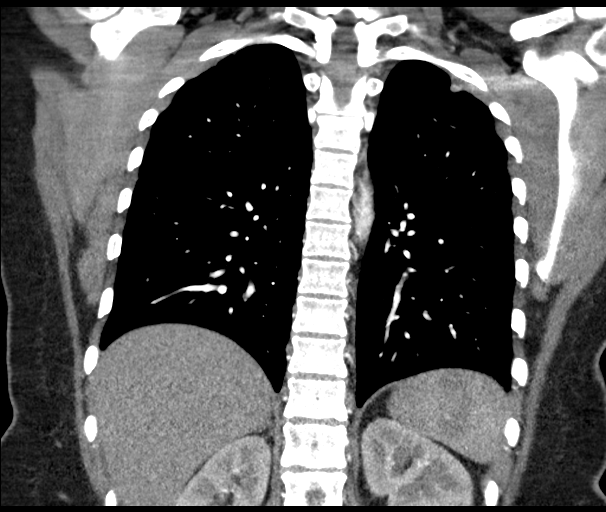

[19 of 46 positions shown; findings below may reference images not displayed]

FINDINGS: Cardiovascular: Satisfactory opacification of pulmonary arteries
noted, and no pulmonary emboli identified. No evidence of thoracic
aortic dissection or aneurysm.

Mediastinum/Nodes: No masses or pathologically enlarged lymph nodes
identified.

Lungs/Pleura: No pulmonary mass, infiltrate, or effusion.

Upper abdomen: No acute findings.

Musculoskeletal: No suspicious bone lesions identified.

Review of the MIP images confirms the above findings.
IMPRESSION: Negative. No evidence of pulmonary embolism or other active disease.

## 2019-03-26 DIAGNOSIS — Z0000 Encounter for general adult medical examination without abnormal findings: Secondary | ICD-10-CM | POA: Diagnosis not present

## 2019-03-26 DIAGNOSIS — Z1159 Encounter for screening for other viral diseases: Secondary | ICD-10-CM | POA: Diagnosis not present

## 2019-03-26 DIAGNOSIS — R7303 Prediabetes: Secondary | ICD-10-CM | POA: Diagnosis not present

## 2019-03-26 DIAGNOSIS — I10 Essential (primary) hypertension: Secondary | ICD-10-CM | POA: Diagnosis not present

## 2019-04-21 DIAGNOSIS — Z20828 Contact with and (suspected) exposure to other viral communicable diseases: Secondary | ICD-10-CM | POA: Diagnosis not present

## 2019-07-03 DIAGNOSIS — R5383 Other fatigue: Secondary | ICD-10-CM | POA: Diagnosis not present

## 2019-07-03 DIAGNOSIS — R350 Frequency of micturition: Secondary | ICD-10-CM | POA: Diagnosis not present

## 2019-07-03 DIAGNOSIS — G4709 Other insomnia: Secondary | ICD-10-CM | POA: Diagnosis not present

## 2020-02-19 DIAGNOSIS — Z20822 Contact with and (suspected) exposure to covid-19: Secondary | ICD-10-CM | POA: Diagnosis not present

## 2020-02-26 DIAGNOSIS — Z20822 Contact with and (suspected) exposure to covid-19: Secondary | ICD-10-CM | POA: Diagnosis not present

## 2020-04-05 DIAGNOSIS — Z20822 Contact with and (suspected) exposure to covid-19: Secondary | ICD-10-CM | POA: Diagnosis not present

## 2020-04-27 DIAGNOSIS — Z0000 Encounter for general adult medical examination without abnormal findings: Secondary | ICD-10-CM | POA: Diagnosis not present

## 2020-05-19 DIAGNOSIS — J209 Acute bronchitis, unspecified: Secondary | ICD-10-CM | POA: Diagnosis not present

## 2020-05-19 DIAGNOSIS — R058 Other specified cough: Secondary | ICD-10-CM | POA: Diagnosis not present

## 2020-05-19 DIAGNOSIS — J019 Acute sinusitis, unspecified: Secondary | ICD-10-CM | POA: Diagnosis not present

## 2020-12-22 DIAGNOSIS — Z03818 Encounter for observation for suspected exposure to other biological agents ruled out: Secondary | ICD-10-CM | POA: Diagnosis not present

## 2020-12-22 DIAGNOSIS — R6889 Other general symptoms and signs: Secondary | ICD-10-CM | POA: Diagnosis not present

## 2020-12-22 DIAGNOSIS — Z20828 Contact with and (suspected) exposure to other viral communicable diseases: Secondary | ICD-10-CM | POA: Diagnosis not present

## 2020-12-22 DIAGNOSIS — J028 Acute pharyngitis due to other specified organisms: Secondary | ICD-10-CM | POA: Diagnosis not present

## 2021-01-14 DIAGNOSIS — R200 Anesthesia of skin: Secondary | ICD-10-CM | POA: Diagnosis not present

## 2021-01-14 DIAGNOSIS — R202 Paresthesia of skin: Secondary | ICD-10-CM | POA: Diagnosis not present

## 2021-01-14 DIAGNOSIS — I10 Essential (primary) hypertension: Secondary | ICD-10-CM | POA: Diagnosis not present

## 2021-01-14 DIAGNOSIS — G43111 Migraine with aura, intractable, with status migrainosus: Secondary | ICD-10-CM | POA: Diagnosis not present

## 2021-02-09 ENCOUNTER — Emergency Department
Admission: EM | Admit: 2021-02-09 | Discharge: 2021-02-10 | Disposition: A | Discharge status: Home / Self Care | Payer: Medicaid Other | Attending: Emergency Medicine | Admitting: Emergency Medicine

## 2021-02-09 ENCOUNTER — Emergency Department: Payer: Medicaid Other

## 2021-02-09 ENCOUNTER — Other Ambulatory Visit: Payer: Self-pay

## 2021-02-09 DIAGNOSIS — R1031 Right lower quadrant pain: Secondary | ICD-10-CM | POA: Diagnosis not present

## 2021-02-09 DIAGNOSIS — Z20822 Contact with and (suspected) exposure to covid-19: Secondary | ICD-10-CM | POA: Insufficient documentation

## 2021-02-09 DIAGNOSIS — N132 Hydronephrosis with renal and ureteral calculous obstruction: Secondary | ICD-10-CM | POA: Diagnosis not present

## 2021-02-09 DIAGNOSIS — I10 Essential (primary) hypertension: Secondary | ICD-10-CM | POA: Diagnosis not present

## 2021-02-09 DIAGNOSIS — N9489 Other specified conditions associated with female genital organs and menstrual cycle: Secondary | ICD-10-CM | POA: Insufficient documentation

## 2021-02-09 DIAGNOSIS — N200 Calculus of kidney: Secondary | ICD-10-CM

## 2021-02-09 DIAGNOSIS — R109 Unspecified abdominal pain: Secondary | ICD-10-CM

## 2021-02-09 LAB — BASIC METABOLIC PANEL
Anion gap: 9 (ref 5–15)
BUN: 14 mg/dL (ref 6–20)
CO2: 22 mmol/L (ref 22–32)
Calcium: 9 mg/dL (ref 8.9–10.3)
Chloride: 104 mmol/L (ref 98–111)
Creatinine, Ser: 0.85 mg/dL (ref 0.44–1.00)
GFR, Estimated: >60 mL/min (ref >60)
Glucose, Bld: 125 mg/dL — ABNORMAL HIGH (ref 70–99)
Potassium: 4.1 mmol/L (ref 3.5–5.1)
Sodium: 135 mmol/L (ref 135–145)

## 2021-02-09 LAB — URINALYSIS, COMPLETE (UACMP) WITH MICROSCOPIC
Bacteria, UA: NONE SEEN
Bilirubin Urine: NEGATIVE
Glucose, UA: NEGATIVE mg/dL
Ketones, ur: 20 mg/dL — AB
Leukocytes,Ua: NEGATIVE
Nitrite: NEGATIVE
Protein, ur: NEGATIVE mg/dL
Specific Gravity, Urine: 1.016 (ref 1.005–1.030)
pH: 6 (ref 5.0–8.0)

## 2021-02-09 LAB — CBC
HCT: 43.3 % (ref 36.0–46.0)
Hemoglobin: 14.5 g/dL (ref 12.0–15.0)
MCH: 28 pg (ref 26.0–34.0)
MCHC: 33.5 g/dL (ref 30.0–36.0)
MCV: 83.8 fL (ref 80.0–100.0)
Platelets: 261 10*3/uL (ref 150–400)
RBC: 5.17 MIL/uL — ABNORMAL HIGH (ref 3.87–5.11)
RDW: 12.4 % (ref 11.5–15.5)
WBC: 14.7 10*3/uL — ABNORMAL HIGH (ref 4.0–10.5)
nRBC: 0 % (ref 0.0–0.2)

## 2021-02-09 LAB — RESP PANEL BY RT-PCR (FLU A&B, COVID) ARPGX2
Influenza A by PCR: NEGATIVE
Influenza B by PCR: NEGATIVE
SARS Coronavirus 2 by RT PCR: NEGATIVE

## 2021-02-09 LAB — HCG, QUANTITATIVE, PREGNANCY: hCG, Beta Chain, Quant, S: <1 m[IU]/mL (ref <5)

## 2021-02-09 MED ORDER — MORPHINE SULFATE (PF) 4 MG/ML IV SOLN
4.0000 mg | Freq: Once | INTRAVENOUS | Status: AC
  Administered 2021-02-09: 4 mg via INTRAVENOUS
  Filled 2021-02-09: qty 1

## 2021-02-09 MED ORDER — ONDANSETRON HCL 4 MG PO TABS: 4.0000 mg | ORAL_TABLET | Freq: Three times a day (TID) | ORAL | 0 refills | Status: DC | PRN

## 2021-02-09 MED ORDER — KETOROLAC TROMETHAMINE 10 MG PO TABS: 10.0000 mg | ORAL_TABLET | Freq: Three times a day (TID) | ORAL | 0 refills | Status: AC | PRN

## 2021-02-09 MED ORDER — SODIUM CHLORIDE 0.9 % IV BOLUS
1000.0000 mL | Freq: Once | INTRAVENOUS | Status: AC
Start: 2021-02-09 — End: 2021-02-09
  Administered 2021-02-09: 1000 mL via INTRAVENOUS

## 2021-02-09 MED ORDER — OXYCODONE-ACETAMINOPHEN 5-325 MG PO TABS
1.0000 | ORAL_TABLET | ORAL | Status: DC | PRN
  Administered 2021-02-09: 1 via ORAL
  Filled 2021-02-09: qty 1

## 2021-02-09 MED ORDER — KETOROLAC TROMETHAMINE 30 MG/ML IJ SOLN
30.0000 mg | Freq: Once | INTRAMUSCULAR | Status: AC
Start: 2021-02-09 — End: 2021-02-09
  Administered 2021-02-09: 30 mg via INTRAVENOUS
  Filled 2021-02-09: qty 1

## 2021-02-09 MED ORDER — TAMSULOSIN HCL 0.4 MG PO CAPS: 0.4000 mg | ORAL_CAPSULE | Freq: Every day | ORAL | 0 refills | Status: DC

## 2021-02-09 MED ORDER — FENTANYL CITRATE PF 50 MCG/ML IJ SOSY
50.0000 ug | PREFILLED_SYRINGE | Freq: Once | INTRAMUSCULAR | Status: AC
  Administered 2021-02-09: 50 ug via INTRAVENOUS
  Filled 2021-02-09: qty 1

## 2021-02-09 MED ORDER — ONDANSETRON 4 MG PO TBDP
4.0000 mg | ORAL_TABLET | Freq: Once | ORAL | Status: AC
  Administered 2021-02-09: 4 mg via ORAL
  Filled 2021-02-09: qty 1

## 2021-02-09 MED ORDER — IOHEXOL 300 MG/ML  SOLN
100.0000 mL | Freq: Once | INTRAMUSCULAR | Status: AC | PRN
  Administered 2021-02-09: 100 mL via INTRAVENOUS
  Filled 2021-02-09: qty 100

## 2021-02-09 MED ORDER — HYDROMORPHONE HCL 1 MG/ML IJ SOLN
1.0000 mg | Freq: Once | INTRAMUSCULAR | Status: AC
  Administered 2021-02-09: 1 mg via INTRAVENOUS
  Filled 2021-02-09: qty 1

## 2021-02-09 NOTE — ED Provider Notes (Signed)
[facility] Provider Note    Event Date/Time   First MD Initiated Contact with Patient 02/09/21 1749     (approximate)   History   Flank Pain   HPI  Sarah Contreras is a 36 y.o. female  who, per primary care office visit dated 12.15.22 has history of HTN, migraines, who presents to the emergency department today because of concerns for right flank pain.  Symptoms started a few hours prior to arrival.  She states the pain initially was in the right lower quadrant but now radiates up to her right flank.  This has been accompanied by multiple episodes of nausea and vomiting.  Vomiting was productive of yellow emesis.  Patient has not noticed any change to her bowel movements or urination recently.  She denies similar pain in the past.  No recent fevers.      Physical Exam   Triage Vital Signs: ED Triage Vitals  Enc Vitals Group     BP 02/09/21 1532 (!) 132/105     Pulse Rate 02/09/21 1532 (!) 102     Resp 02/09/21 1530 20     Temp 02/09/21 1530 98.2 F (36.8 C)     Temp Source 02/09/21 1530 Oral     SpO2 02/09/21 1532 97 %     Weight 02/09/21 1530 210 lb (95.3 kg)     Height 02/09/21 1530 5' (1.524 m)     Head Circumference --      Peak Flow --      Pain Score 02/09/21 1530 10     Pain Loc --      Pain Edu? --      Excl. in [place]? --     Most recent vital signs: Vitals:   02/09/21 1919 02/09/21 2100  BP: (!) 143/96 (!) 122/106  Pulse: 93 97  Resp: 20 20  Temp:    SpO2: 99% 94%    General: Awake, no distress.  CV:  Good peripheral perfusion.  Resp:  Normal effort.  Abd:  No distention. No CVA tenderness. No RLQ tenderness.   ED Results / Procedures / Treatments   Labs (all labs ordered are listed, but only abnormal results are displayed) Labs Reviewed  CBC - Abnormal; Notable for the following components:      Result Value   WBC 14.7 (*)    RBC 5.17 (*)    All other components within normal limits  BASIC METABOLIC PANEL - Abnormal;  Notable for the following components:   Glucose, Bld 125 (*)    All other components within normal limits  URINALYSIS, COMPLETE (UACMP) WITH MICROSCOPIC - Abnormal; Notable for the following components:   Color, Urine YELLOW (*)    APPearance HAZY (*)    Hgb urine dipstick LARGE (*)    Ketones, ur 20 (*)    All other components within normal limits  RESP PANEL BY RT-PCR (FLU A&B, COVID) ARPGX2  HCG, QUANTITATIVE, PREGNANCY     EKG  None   RADIOLOGY CT abd/pel My interpretation: Right sided UVJ stone Radiology interpretation:  IMPRESSION:  1. Mild right hydronephrosis and hydroureter, secondary to a  punctate 1-2 mm stone at or just past the right UVJ. There is  moderate right perinephric stranding and small amount of perinephric  fluid. The bladder appears slightly thick-walled with stranding,  correlate for concurrent cystitis.  2. Negative for acute appendicitis      PROCEDURES:  Critical Care performed: No  Procedures  IMPRESSION / MDM / ASSESSMENT AND PLAN / ED COURSE  I reviewed the triage vital signs and the nursing notes.                              Differential diagnosis includes, but is not limited to, appendicitis, kidney stone, urinary tract infection, colitis, diverticulitis.  Patient presented to the emergency department today because of concerns for right lower quadrant pain as well as nausea and vomiting that started today.  On exam patient appears uncomfortable however is not tender to palpation in the right lower quadrant.  No CVA tenderness.  Patient's blood work did have a mild leukocytosis.  CT abdomen pelvis was obtained.  This showed a right UVJ stone with some associated hydronephrosis.  Urine did not show any signs of infection with a negative leukocytosis, bacteria and nitrite.  Blood work without signs for acute kidney injury.  Will attempt pain control and if pain control can be achieved I think patient can be discharged.   FINAL  CLINICAL IMPRESSION(S) / ED DIAGNOSES   Final diagnoses:  Kidney stone  Right flank pain    Note:  This document was prepared using Dragon voice recognition software and may include unintentional dictation errors.    Nance Pear, MD 02/09/21 2352

## 2021-02-09 NOTE — Discharge Instructions (Addendum)
Please seek medical attention for any high fevers, chest pain, shortness of breath, change in behavior, persistent vomiting, bloody stool or any other new or concerning symptoms.  

## 2021-02-09 NOTE — ED Triage Notes (Signed)
Pt to ED for right side flank pain and nausea that started a couple hours ago.  Denies hx kidney stones.  Denies urinary sx

## 2021-02-10 MED ORDER — TAMSULOSIN HCL 0.4 MG PO CAPS: 0.4000 mg | ORAL_CAPSULE | Freq: Every day | ORAL | 0 refills | Status: AC

## 2021-02-10 MED ORDER — TAMSULOSIN HCL 0.4 MG PO CAPS
0.4000 mg | ORAL_CAPSULE | Freq: Every day | ORAL | 0 refills | Status: DC
Start: 2021-02-10 — End: 2021-02-10

## 2021-02-10 MED ORDER — KETOROLAC TROMETHAMINE 10 MG PO TABS: 10.0000 mg | ORAL_TABLET | Freq: Three times a day (TID) | ORAL | 0 refills | Status: AC

## 2021-02-10 MED ORDER — ONDANSETRON 4 MG PO TBDP: 4.0000 mg | ORAL_TABLET | Freq: Three times a day (TID) | ORAL | 0 refills | Status: AC | PRN

## 2021-06-16 DIAGNOSIS — B9689 Other specified bacterial agents as the cause of diseases classified elsewhere: Secondary | ICD-10-CM | POA: Diagnosis not present

## 2021-06-16 DIAGNOSIS — J019 Acute sinusitis, unspecified: Secondary | ICD-10-CM | POA: Diagnosis not present

## 2022-01-17 DIAGNOSIS — I10 Essential (primary) hypertension: Secondary | ICD-10-CM | POA: Diagnosis not present

## 2022-01-17 DIAGNOSIS — J4530 Mild persistent asthma, uncomplicated: Secondary | ICD-10-CM | POA: Diagnosis not present

## 2022-01-17 DIAGNOSIS — Z79899 Other long term (current) drug therapy: Secondary | ICD-10-CM | POA: Diagnosis not present

## 2022-01-17 DIAGNOSIS — G43111 Migraine with aura, intractable, with status migrainosus: Secondary | ICD-10-CM | POA: Diagnosis not present

## 2022-01-17 DIAGNOSIS — Z1331 Encounter for screening for depression: Secondary | ICD-10-CM | POA: Diagnosis not present

## 2022-01-17 DIAGNOSIS — Z0000 Encounter for general adult medical examination without abnormal findings: Secondary | ICD-10-CM | POA: Diagnosis not present

## 2022-01-17 DIAGNOSIS — Z23 Encounter for immunization: Secondary | ICD-10-CM | POA: Diagnosis not present

## 2022-01-17 DIAGNOSIS — E282 Polycystic ovarian syndrome: Secondary | ICD-10-CM | POA: Diagnosis not present

## 2022-01-17 DIAGNOSIS — Z136 Encounter for screening for cardiovascular disorders: Secondary | ICD-10-CM | POA: Diagnosis not present

## 2022-01-17 DIAGNOSIS — L680 Hirsutism: Secondary | ICD-10-CM | POA: Diagnosis not present

## 2022-01-17 DIAGNOSIS — Z131 Encounter for screening for diabetes mellitus: Secondary | ICD-10-CM | POA: Diagnosis not present

## 2022-01-17 DIAGNOSIS — R5383 Other fatigue: Secondary | ICD-10-CM | POA: Diagnosis not present

## 2023-05-24 DIAGNOSIS — I10 Essential (primary) hypertension: Secondary | ICD-10-CM | POA: Diagnosis not present

## 2023-05-24 DIAGNOSIS — J4530 Mild persistent asthma, uncomplicated: Secondary | ICD-10-CM | POA: Diagnosis not present

## 2023-05-24 DIAGNOSIS — G43111 Migraine with aura, intractable, with status migrainosus: Secondary | ICD-10-CM | POA: Diagnosis not present

## 2023-05-24 DIAGNOSIS — F3181 Bipolar II disorder: Secondary | ICD-10-CM | POA: Diagnosis not present

## 2023-05-24 DIAGNOSIS — J020 Streptococcal pharyngitis: Secondary | ICD-10-CM | POA: Diagnosis not present

## 2023-06-08 DIAGNOSIS — N898 Other specified noninflammatory disorders of vagina: Secondary | ICD-10-CM | POA: Diagnosis not present

## 2023-06-08 DIAGNOSIS — Z1331 Encounter for screening for depression: Secondary | ICD-10-CM | POA: Diagnosis not present

## 2023-06-08 DIAGNOSIS — F3181 Bipolar II disorder: Secondary | ICD-10-CM | POA: Diagnosis not present

## 2023-06-08 DIAGNOSIS — E282 Polycystic ovarian syndrome: Secondary | ICD-10-CM | POA: Diagnosis not present

## 2023-06-08 DIAGNOSIS — R195 Other fecal abnormalities: Secondary | ICD-10-CM | POA: Diagnosis not present

## 2023-06-08 DIAGNOSIS — Z0000 Encounter for general adult medical examination without abnormal findings: Secondary | ICD-10-CM | POA: Diagnosis not present

## 2023-06-08 DIAGNOSIS — Z136 Encounter for screening for cardiovascular disorders: Secondary | ICD-10-CM | POA: Diagnosis not present

## 2023-06-08 DIAGNOSIS — F5101 Primary insomnia: Secondary | ICD-10-CM | POA: Diagnosis not present

## 2023-06-08 DIAGNOSIS — Z0001 Encounter for general adult medical examination with abnormal findings: Secondary | ICD-10-CM | POA: Diagnosis not present

## 2023-07-19 DIAGNOSIS — H52223 Regular astigmatism, bilateral: Secondary | ICD-10-CM | POA: Diagnosis not present
